# Patient Record
Sex: Female | Born: 1985 | Race: White | Hispanic: No | Marital: Married | Smoking: Current every day smoker
Health system: Southern US, Community
[De-identification: ages and names within clinical notes are randomized; demographics above are authoritative.]

## PROBLEM LIST (undated history)

## (undated) DIAGNOSIS — F419 Anxiety disorder, unspecified: Secondary | ICD-10-CM

## (undated) DIAGNOSIS — R519 Headache, unspecified: Secondary | ICD-10-CM

## (undated) DIAGNOSIS — Z8719 Personal history of other diseases of the digestive system: Secondary | ICD-10-CM

## (undated) DIAGNOSIS — Z9889 Other specified postprocedural states: Secondary | ICD-10-CM

## (undated) DIAGNOSIS — G500 Trigeminal neuralgia: Secondary | ICD-10-CM

## (undated) DIAGNOSIS — A281 Cat-scratch disease: Secondary | ICD-10-CM

## (undated) DIAGNOSIS — T8859XA Other complications of anesthesia, initial encounter: Secondary | ICD-10-CM

## (undated) DIAGNOSIS — R112 Nausea with vomiting, unspecified: Secondary | ICD-10-CM

## (undated) DIAGNOSIS — R7303 Prediabetes: Secondary | ICD-10-CM

## (undated) HISTORY — PX: OTHER SURGICAL HISTORY: SHX169

## (undated) HISTORY — PX: CHOLECYSTECTOMY: SHX55

## (undated) HISTORY — PX: ABDOMINAL HYSTERECTOMY: SHX81

---

## 2020-03-08 ENCOUNTER — Encounter (HOSPITAL_COMMUNITY): Payer: Self-pay | Admitting: Emergency Medicine

## 2020-03-08 ENCOUNTER — Emergency Department (HOSPITAL_COMMUNITY)
Admission: EM | Admit: 2020-03-08 | Discharge: 2020-03-08 | Disposition: A | Discharge status: Home / Self Care | Payer: BC Managed Care – PPO | Attending: Emergency Medicine | Admitting: Emergency Medicine

## 2020-03-08 ENCOUNTER — Other Ambulatory Visit: Payer: Self-pay

## 2020-03-08 DIAGNOSIS — F17210 Nicotine dependence, cigarettes, uncomplicated: Secondary | ICD-10-CM | POA: Insufficient documentation

## 2020-03-08 DIAGNOSIS — G500 Trigeminal neuralgia: Secondary | ICD-10-CM | POA: Insufficient documentation

## 2020-03-08 DIAGNOSIS — G501 Atypical facial pain: Secondary | ICD-10-CM | POA: Diagnosis present

## 2020-03-08 HISTORY — DX: Trigeminal neuralgia: G50.0

## 2020-03-08 MED ORDER — HYDROMORPHONE HCL 1 MG/ML IJ SOLN
1.0000 mg | Freq: Once | INTRAMUSCULAR | Status: AC
  Administered 2020-03-08: 1 mg via INTRAMUSCULAR
  Filled 2020-03-08: qty 1

## 2020-03-08 MED ORDER — CARBAMAZEPINE ER 100 MG PO TB12: 100.0000 mg | ORAL_TABLET | Freq: Two times a day (BID) | ORAL | 0 refills | Status: AC

## 2020-03-08 MED ORDER — HYDROCODONE-ACETAMINOPHEN 5-325 MG PO TABS
2.0000 | ORAL_TABLET | ORAL | 0 refills | Status: DC | PRN
Start: 2020-03-08 — End: 2020-03-11

## 2020-03-08 MED ORDER — CARBAMAZEPINE ER 100 MG PO TB12: 100.0000 mg | ORAL_TABLET | Freq: Two times a day (BID) | ORAL | 0 refills | Status: DC

## 2020-03-08 NOTE — ED Provider Notes (Signed)
[facility]-EMERGENCY DEPT Provider Note   CSN: [id number] Arrival date & time: 03/08/20  1640     History Chief Complaint  Patient presents with  . Trigeminal Neuralgia    Diana Ibarra is a 34 y.o. female.  HPI Patient presents with right-sided facial pain.  History of trigeminal neuralgia for years now.  States that she has been seen by neurosurgery and had a surgery but separated the nerve from the artery.  All this was done in [place].  Recently moved to [facility].  Does not have a provider here yet.  States she flares up when he gets cold.  States she is on Tegretol but does not initially remember the dose.  Later told nursing she is on 100 mg.  Pain is severe and on the right side of her face.  Typical to pain she has had before.  Worse with movement and palpation.    Past Medical History:  Diagnosis Date  . Trigeminal neuralgia     There are no problems to display for this patient.   Past Surgical History:  Procedure Laterality Date  . ABDOMINAL HYSTERECTOMY       OB History   No obstetric history on file.     History reviewed. No pertinent family history.  Social History   Tobacco Use  . Smoking status: Current Every Day Smoker    Types: Cigarettes  . Smokeless tobacco: Never Used  Vaping Use  . Vaping Use: Never used  Substance Use Topics  . Alcohol use: Never  . Drug use: Never    Home Medications Prior to Admission medications   Medication Sig Start Date End Date Taking? Authorizing Provider  carbamazepine (TEGRETOL-XR) 100 MG 12 hr tablet Take 1 tablet (100 mg total) by mouth 2 (two) times daily. 03/08/20   Benjiman Core, MD    Allergies    Penicillins and Tylenol with codeine #3 [acetaminophen-codeine]  Review of Systems   Review of Systems  Constitutional: Negative for appetite change.  HENT: Negative for facial swelling, trouble swallowing and voice change.        Right facial pain.  Eyes: Negative for  pain and visual disturbance.  Respiratory: Negative for shortness of breath.   Cardiovascular: Negative for chest pain.  Gastrointestinal: Negative for abdominal pain.  Musculoskeletal: Negative for back pain.  Skin: Negative for rash.  Neurological: Negative for weakness and numbness.    Physical Exam Updated Vital Signs BP 134/88 (BP Location: Left Arm)   Pulse 86   Temp (!) 97.3 F (36.3 C) (Oral)   Resp 18   Ht 5\' 6"  (1.676 m)   Wt 98.9 kg   SpO2 98%   BMI 35.19 kg/m   Physical Exam Vitals and nursing note reviewed.  Constitutional:      Comments: Patient appears in pain.  HENT:     Head: Atraumatic.     Comments: Tattoo on her right lower posterior skull.  It is the date that she shaved her head for the surgery.  Tenderness right face.  No skin changes.  No swelling. Eyes:     Pupils: Pupils are equal, round, and reactive to light.  Cardiovascular:     Rate and Rhythm: Regular rhythm.  Pulmonary:     Breath sounds: No wheezing or rhonchi.  Abdominal:     Tenderness: There is no abdominal tenderness.  Musculoskeletal:        General: No tenderness.     Cervical back: Neck supple.  Skin:    General: Skin is warm.     Capillary Refill: Capillary refill takes less than 2 seconds.  Neurological:     Mental Status: She is alert and oriented to person, place, and time.     ED Results / Procedures / Treatments   Labs (all labs ordered are listed, but only abnormal results are displayed) Labs Reviewed - No data to display  EKG None  Radiology No results found.  Procedures Procedures (including critical care time)  Medications Ordered in ED Medications  HYDROmorphone (DILAUDID) injection 1 mg (1 mg Intramuscular Given 03/08/20 1843)    ED Course  I have reviewed the triage vital signs and the nursing notes.  Pertinent labs & imaging results that were available during my care of the patient were reviewed by me and considered in my medical decision  making (see chart for details).    MDM Rules/Calculators/A&P                          Patient with history of trigeminal neuralgia.  Only takes medicines when she flares up.  Recently moved to [place].  Will restart her carbamazepine with a short course.  Can follow-up with neurosurgery like she was previously.  Treated with pain medicines here.  Do not think she needs much further work-up with known diagnosis   Final Clinical Impression(s) / ED Diagnoses Final diagnoses:  Trigeminal neuralgia    Rx / DC Orders ED Discharge Orders         Ordered    carbamazepine (TEGRETOL-XR) 100 MG 12 hr tablet  2 times daily        03/08/20 1939           Benjiman Core, MD 03/08/20 1942

## 2020-03-08 NOTE — Discharge Instructions (Signed)
Follow-up with neurosurgery to see if they can help with the symptoms.

## 2020-03-08 NOTE — ED Triage Notes (Signed)
Patient reports she has a flare up of her trigeminal neuralgia for 1 week  now. Pain rated 5/10. Right side.

## 2020-03-11 ENCOUNTER — Emergency Department (HOSPITAL_COMMUNITY)
Admission: EM | Admit: 2020-03-11 | Discharge: 2020-03-11 | Disposition: A | Discharge status: Home / Self Care | Payer: BC Managed Care – PPO | Attending: Emergency Medicine | Admitting: Emergency Medicine

## 2020-03-11 ENCOUNTER — Other Ambulatory Visit: Payer: Self-pay

## 2020-03-11 ENCOUNTER — Encounter (HOSPITAL_COMMUNITY): Payer: Self-pay

## 2020-03-11 DIAGNOSIS — Z8669 Personal history of other diseases of the nervous system and sense organs: Secondary | ICD-10-CM | POA: Diagnosis not present

## 2020-03-11 DIAGNOSIS — Z79899 Other long term (current) drug therapy: Secondary | ICD-10-CM | POA: Insufficient documentation

## 2020-03-11 DIAGNOSIS — R519 Headache, unspecified: Secondary | ICD-10-CM | POA: Insufficient documentation

## 2020-03-11 DIAGNOSIS — F17210 Nicotine dependence, cigarettes, uncomplicated: Secondary | ICD-10-CM | POA: Insufficient documentation

## 2020-03-11 MED ORDER — HYDROCODONE-ACETAMINOPHEN 5-325 MG PO TABS
1.0000 | ORAL_TABLET | Freq: Four times a day (QID) | ORAL | 0 refills | Status: DC | PRN
Start: 2020-03-11 — End: 2020-04-07

## 2020-03-11 MED ORDER — HYDROMORPHONE HCL 1 MG/ML IJ SOLN
1.0000 mg | Freq: Once | INTRAMUSCULAR | Status: AC
  Administered 2020-03-11: 1 mg via INTRAMUSCULAR
  Filled 2020-03-11: qty 1

## 2020-03-11 NOTE — ED Triage Notes (Signed)
Patient c/o history of trigeminal neuralgia on the right side. Patient reports that she is out of Hydrocodon and the pain has returned.

## 2020-03-11 NOTE — ED Provider Notes (Signed)
[facility]-EMERGENCY DEPT Provider Note   CSN: [id number] Arrival date & time: 03/11/20  1129     History Chief Complaint  Patient presents with  . Trigeminal Neuralgia    Diana Ibarra is a 34 y.o. female.  HPI      Diana Ibarra is a 34 y.o. female, with a history of trigeminal neuralgia, presenting to the ED with right-sided facial pain which she associates with flare of trigeminal neuralgia.  Pain is severe, sharp, with spikes of shocklike pain, radiating throughout the right side of the face.   She states she recently moved to the area from [place] and was previously managed by a neurosurgeon there. She has been taking ibuprofen and the Tegretol, however, her pain is still severe.  Since she was seen in the ED on November 27, patient has since made an appointment with neurosurgery and will be seen in the office at 1 PM on December 2.  Denies fever, numbness, other pain, difficulty swallowing, facial droop, vision changes, hearing changes, rash, or any other complaints.   Past Medical History:  Diagnosis Date  . Trigeminal neuralgia     There are no problems to display for this patient.   Past Surgical History:  Procedure Laterality Date  . ABDOMINAL HYSTERECTOMY       OB History   No obstetric history on file.     Family History  Problem Relation Age of Onset  . Cancer Mother     Social History   Tobacco Use  . Smoking status: Current Every Day Smoker    Packs/day: 0.15    Types: Cigarettes  . Smokeless tobacco: Never Used  Vaping Use  . Vaping Use: Never used  Substance Use Topics  . Alcohol use: Never  . Drug use: Never    Home Medications Prior to Admission medications   Medication Sig Start Date End Date Taking? Authorizing Provider  carbamazepine (TEGRETOL-XR) 100 MG 12 hr tablet Take 1 tablet (100 mg total) by mouth 2 (two) times daily. 03/08/20   Benjiman Core, MD  HYDROcodone-acetaminophen (NORCO/VICODIN)  5-325 MG tablet Take 1 tablet by mouth every 6 (six) hours as needed for severe pain. 03/11/20   Hania Cerone C, PA-C    Allergies    Penicillins and Tylenol with codeine #3 [acetaminophen-codeine]  Review of Systems   Review of Systems  Constitutional: Negative for chills and fever.  HENT: Negative for facial swelling, trouble swallowing and voice change.        Right-sided facial pain  Eyes: Negative for visual disturbance.  Respiratory: Negative for shortness of breath.   Cardiovascular: Negative for chest pain.  Gastrointestinal: Negative for nausea and vomiting.  Musculoskeletal: Negative for neck pain and neck stiffness.  Skin: Negative for rash.  Neurological: Negative for dizziness, weakness, numbness and headaches.    Physical Exam Updated Vital Signs BP (!) 126/96 (BP Location: Right Arm)   Pulse 100   Temp 97.6 F (36.4 C) (Oral)   Resp 16   Ht 5\' 6"  (1.676 m)   Wt 99.8 kg   SpO2 98%   BMI 35.51 kg/m   Physical Exam Vitals and nursing note reviewed.  Constitutional:      General: She is not in acute distress.    Appearance: She is well-developed. She is not diaphoretic.  HENT:     Head: Normocephalic and atraumatic.     Comments: Right-sided facial tenderness without swelling, color abnormality, lesions, or any other noted abnormalities.  Mouth/Throat:     Mouth: Mucous membranes are moist.     Pharynx: Oropharynx is clear.     Comments: No noted area of intraoral swelling or fluctuance.  No trismus or noted abnormal phonation.  Mouth opening to at least 3 finger widths.  Handles oral secretions without difficulty.  No sublingual swelling or tongue elevation.  No swelling or tenderness to the submental or submandibular regions.  No swelling or tenderness into the soft tissues of the neck. Eyes:     Extraocular Movements: Extraocular movements intact.     Conjunctiva/sclera: Conjunctivae normal.     Pupils: Pupils are equal, round, and reactive to light.   Cardiovascular:     Rate and Rhythm: Normal rate and regular rhythm.  Pulmonary:     Effort: Pulmonary effort is normal.  Musculoskeletal:     Cervical back: Neck supple.  Skin:    General: Skin is warm and dry.     Coloration: Skin is not pale.  Neurological:     Mental Status: She is alert.  Psychiatric:        Behavior: Behavior normal.     ED Results / Procedures / Treatments   Labs (all labs ordered are listed, but only abnormal results are displayed) Labs Reviewed - No data to display  EKG None  Radiology No results found.  Procedures Procedures (including critical care time)  Medications Ordered in ED Medications  HYDROmorphone (DILAUDID) injection 1 mg (1 mg Intramuscular Given 03/11/20 1349)    ED Course  I have reviewed the triage vital signs and the nursing notes.  Pertinent labs & imaging results that were available during my care of the patient were reviewed by me and considered in my medical decision making (see chart for details).    MDM Rules/Calculators/A&P                          Patient presents with right-sided facial pain that she associates with flare of her trigeminal neuralgia. Since patient states she was able to secure follow-up and she is experiencing acute pain, we will extend some analgesics to bridge until she can see the specialist.  The patient was given instructions for home care as well as return precautions. Patient voices understanding of these instructions, accepts the plan, and is comfortable with discharge.  Findings and plan of care discussed with Pricilla Loveless, MD.   Final Clinical Impression(s) / ED Diagnoses Final diagnoses:  Right sided facial pain    Rx / DC Orders ED Discharge Orders         Ordered    HYDROcodone-acetaminophen (NORCO/VICODIN) 5-325 MG tablet  Every 6 hours PRN        03/11/20 1427           Anselm Pancoast, PA-C 03/12/20 1013    Pricilla Loveless, MD 03/17/20 2029

## 2020-03-11 NOTE — Discharge Instructions (Signed)
Continue with the Tegretol, as prescribed. Follow-up with the neurosurgery office, as planned.

## 2020-04-07 ENCOUNTER — Other Ambulatory Visit: Payer: Self-pay | Admitting: Neurological Surgery

## 2020-04-12 ENCOUNTER — Emergency Department (HOSPITAL_COMMUNITY)
Admission: EM | Admit: 2020-04-12 | Discharge: 2020-04-12 | Disposition: A | Discharge status: Home / Self Care | Payer: BC Managed Care – PPO | Source: Home / Self Care

## 2020-04-12 ENCOUNTER — Encounter (HOSPITAL_COMMUNITY): Payer: Self-pay | Admitting: Obstetrics and Gynecology

## 2020-04-12 ENCOUNTER — Other Ambulatory Visit: Payer: Self-pay

## 2020-04-12 DIAGNOSIS — Z5321 Procedure and treatment not carried out due to patient leaving prior to being seen by health care provider: Secondary | ICD-10-CM | POA: Insufficient documentation

## 2020-04-12 DIAGNOSIS — G500 Trigeminal neuralgia: Secondary | ICD-10-CM | POA: Insufficient documentation

## 2020-04-12 NOTE — ED Triage Notes (Signed)
Patient reports to the ER for Trigeminal neuralgia pain. Patient has a known hx of this and is set to have surgery on Tuesday.

## 2020-04-14 ENCOUNTER — Encounter (HOSPITAL_COMMUNITY): Payer: Self-pay | Admitting: Neurological Surgery

## 2020-04-14 ENCOUNTER — Other Ambulatory Visit (HOSPITAL_COMMUNITY)
Admission: RE | Admit: 2020-04-14 | Discharge: 2020-04-14 | Disposition: A | Discharge status: Home / Self Care | Payer: BC Managed Care – PPO | Source: Ambulatory Visit | Attending: Neurological Surgery | Admitting: Neurological Surgery

## 2020-04-14 DIAGNOSIS — Z20822 Contact with and (suspected) exposure to covid-19: Secondary | ICD-10-CM | POA: Insufficient documentation

## 2020-04-14 DIAGNOSIS — Z01812 Encounter for preprocedural laboratory examination: Secondary | ICD-10-CM | POA: Insufficient documentation

## 2020-04-14 LAB — SARS CORONAVIRUS 2 (TAT 6-24 HRS): SARS Coronavirus 2: NEGATIVE

## 2020-04-14 NOTE — Progress Notes (Signed)
PCP: Patient just moved from [place].  Has not established care.  Cardiologist:  Denies  EKG:  N/A CXR:  N/A ECHO:  denies Stress Test: Denies Cardiac Cath: Denies  Fasting Blood Sugar-  N/A Checks Blood Sugar_N/A__ times a day  OSA/CPAP:  No  ASA/Blood Thinners:  No  Covid test 04/14/20  Anesthesia Review:  No  Patient denies shortness of breath, fever, cough, and chest pain at PAT appointment.  Patient verbalized understanding of instructions provided today at the PAT appointment.  Patient asked to review instructions at home and day of surgery.

## 2020-04-15 ENCOUNTER — Inpatient Hospital Stay (HOSPITAL_COMMUNITY): Payer: BC Managed Care – PPO | Admitting: Anesthesiology

## 2020-04-15 ENCOUNTER — Encounter (HOSPITAL_COMMUNITY): Payer: Self-pay | Admitting: Neurological Surgery

## 2020-04-15 ENCOUNTER — Encounter (HOSPITAL_COMMUNITY)
Admission: RE | Disposition: A | Discharge status: Home / Self Care | Payer: Self-pay | Source: Home / Self Care | Attending: Neurological Surgery

## 2020-04-15 ENCOUNTER — Other Ambulatory Visit: Payer: Self-pay

## 2020-04-15 ENCOUNTER — Inpatient Hospital Stay (HOSPITAL_COMMUNITY)
Admission: RE | Admit: 2020-04-15 | Discharge: 2020-04-18 | DRG: 027 | Disposition: A | Discharge status: Home / Self Care | Payer: BC Managed Care – PPO | Attending: Neurological Surgery | Admitting: Neurological Surgery

## 2020-04-15 DIAGNOSIS — F419 Anxiety disorder, unspecified: Secondary | ICD-10-CM | POA: Diagnosis present

## 2020-04-15 DIAGNOSIS — Z20822 Contact with and (suspected) exposure to covid-19: Secondary | ICD-10-CM | POA: Diagnosis present

## 2020-04-15 DIAGNOSIS — G500 Trigeminal neuralgia: Secondary | ICD-10-CM | POA: Diagnosis present

## 2020-04-15 DIAGNOSIS — F17210 Nicotine dependence, cigarettes, uncomplicated: Secondary | ICD-10-CM | POA: Diagnosis present

## 2020-04-15 DIAGNOSIS — R110 Nausea: Secondary | ICD-10-CM | POA: Diagnosis not present

## 2020-04-15 HISTORY — DX: Cat-scratch disease: A28.1

## 2020-04-15 HISTORY — DX: Other specified postprocedural states: Z98.890

## 2020-04-15 HISTORY — PX: CRANIECTOMY: SHX331

## 2020-04-15 HISTORY — DX: Other complications of anesthesia, initial encounter: T88.59XA

## 2020-04-15 HISTORY — DX: Other specified postprocedural states: R11.2

## 2020-04-15 HISTORY — DX: Anxiety disorder, unspecified: F41.9

## 2020-04-15 LAB — CBC
HCT: 44.3 % (ref 36.0–46.0)
Hemoglobin: 14.9 g/dL (ref 12.0–15.0)
MCH: 30.6 pg (ref 26.0–34.0)
MCHC: 33.6 g/dL (ref 30.0–36.0)
MCV: 91 fL (ref 80.0–100.0)
Platelets: 294 10*3/uL (ref 150–400)
RBC: 4.87 MIL/uL (ref 3.87–5.11)
RDW: 11.7 % (ref 11.5–15.5)
WBC: 8.8 10*3/uL (ref 4.0–10.5)
nRBC: 0 % (ref 0.0–0.2)

## 2020-04-15 LAB — BASIC METABOLIC PANEL
Anion gap: 12 (ref 5–15)
BUN: 11 mg/dL (ref 6–20)
CO2: 19 mmol/L — ABNORMAL LOW (ref 22–32)
Calcium: 10.2 mg/dL (ref 8.9–10.3)
Chloride: 106 mmol/L (ref 98–111)
Creatinine, Ser: 0.72 mg/dL (ref 0.44–1.00)
GFR, Estimated: >60 mL/min (ref >60)
Glucose, Bld: 100 mg/dL — ABNORMAL HIGH (ref 70–99)
Potassium: 4 mmol/L (ref 3.5–5.1)
Sodium: 137 mmol/L (ref 135–145)

## 2020-04-15 LAB — TYPE AND SCREEN
ABO/RH(D): O POS
Antibody Screen: NEGATIVE

## 2020-04-15 LAB — MRSA PCR SCREENING: MRSA by PCR: NEGATIVE

## 2020-04-15 LAB — ABO/RH: ABO/RH(D): O POS

## 2020-04-15 SURGERY — CRANIECTOMY FOR TIC DOULOUREUX
Anesthesia: General | Site: Head | Laterality: Right

## 2020-04-15 MED ORDER — ALPRAZOLAM 0.5 MG PO TABS
1.0000 mg | ORAL_TABLET | Freq: Three times a day (TID) | ORAL | Status: DC | PRN
  Administered 2020-04-15 – 2020-04-16 (×2): 1 mg via ORAL
  Filled 2020-04-15 (×2): qty 2

## 2020-04-15 MED ORDER — MIDAZOLAM HCL 2 MG/2ML IJ SOLN
INTRAMUSCULAR | Status: AC
  Filled 2020-04-15: qty 2

## 2020-04-15 MED ORDER — ONDANSETRON HCL 4 MG PO TABS: 4.0000 mg | ORAL_TABLET | Freq: Four times a day (QID) | ORAL | Status: DC | PRN

## 2020-04-15 MED ORDER — ONDANSETRON HCL 4 MG/2ML IJ SOLN
INTRAMUSCULAR | Status: DC | PRN
  Administered 2020-04-15: 4 mg via INTRAVENOUS

## 2020-04-15 MED ORDER — THROMBIN 20000 UNITS EX SOLR: CUTANEOUS | Status: DC | PRN

## 2020-04-15 MED ORDER — BACITRACIN ZINC 500 UNIT/GM EX OINT
TOPICAL_OINTMENT | CUTANEOUS | Status: AC
  Filled 2020-04-15: qty 28.35

## 2020-04-15 MED ORDER — PHENOL 1.4 % MT LIQD: 1.0000 | OROMUCOSAL | Status: DC | PRN

## 2020-04-15 MED ORDER — FENTANYL CITRATE (PF) 250 MCG/5ML IJ SOLN
INTRAMUSCULAR | Status: AC
  Filled 2020-04-15: qty 5

## 2020-04-15 MED ORDER — CEFAZOLIN SODIUM-DEXTROSE 2-3 GM-%(50ML) IV SOLR
INTRAVENOUS | Status: DC | PRN
  Administered 2020-04-15: 2 g via INTRAVENOUS

## 2020-04-15 MED ORDER — THROMBIN 5000 UNITS EX SOLR: OROMUCOSAL | Status: DC | PRN

## 2020-04-15 MED ORDER — LIDOCAINE-EPINEPHRINE 1 %-1:100000 IJ SOLN
INTRAMUSCULAR | Status: DC | PRN
  Administered 2020-04-15: 8 mL

## 2020-04-15 MED ORDER — LACTATED RINGERS IV SOLN: INTRAVENOUS | Status: DC

## 2020-04-15 MED ORDER — ROCURONIUM BROMIDE 10 MG/ML (PF) SYRINGE
PREFILLED_SYRINGE | INTRAVENOUS | Status: DC | PRN
  Administered 2020-04-15: 20 mg via INTRAVENOUS
  Administered 2020-04-15: 30 mg via INTRAVENOUS
  Administered 2020-04-15: 50 mg via INTRAVENOUS
  Administered 2020-04-15: 20 mg via INTRAVENOUS

## 2020-04-15 MED ORDER — HYDROMORPHONE HCL 1 MG/ML IJ SOLN
INTRAMUSCULAR | Status: AC
  Filled 2020-04-15: qty 0.5

## 2020-04-15 MED ORDER — FENTANYL CITRATE (PF) 100 MCG/2ML IJ SOLN
INTRAMUSCULAR | Status: AC
  Filled 2020-04-15: qty 2

## 2020-04-15 MED ORDER — SODIUM CHLORIDE 0.9 % IV SOLN: 250.0000 mL | INTRAVENOUS | Status: DC

## 2020-04-15 MED ORDER — OXYCODONE HCL 5 MG PO TABS
10.0000 mg | ORAL_TABLET | ORAL | Status: DC | PRN
  Administered 2020-04-16: 10 mg via ORAL
  Filled 2020-04-15: qty 2

## 2020-04-15 MED ORDER — ESMOLOL HCL 100 MG/10ML IV SOLN
INTRAVENOUS | Status: DC | PRN
  Administered 2020-04-15: 30 mg via INTRAVENOUS

## 2020-04-15 MED ORDER — ROPINIROLE HCL 1 MG PO TABS
0.5000 mg | ORAL_TABLET | Freq: Every day | ORAL | Status: DC
  Administered 2020-04-15 – 2020-04-17 (×3): 0.5 mg via ORAL
  Filled 2020-04-15 (×4): qty 1

## 2020-04-15 MED ORDER — HEMOSTATIC AGENTS (NO CHARGE) OPTIME
TOPICAL | Status: DC | PRN
  Administered 2020-04-15: 1 via TOPICAL

## 2020-04-15 MED ORDER — LIDOCAINE-EPINEPHRINE 1 %-1:100000 IJ SOLN
INTRAMUSCULAR | Status: AC
  Filled 2020-04-15: qty 1

## 2020-04-15 MED ORDER — FENTANYL CITRATE (PF) 250 MCG/5ML IJ SOLN
INTRAMUSCULAR | Status: DC | PRN
  Administered 2020-04-15 (×2): 50 ug via INTRAVENOUS
  Administered 2020-04-15: 100 ug via INTRAVENOUS
  Administered 2020-04-15: 50 ug via INTRAVENOUS

## 2020-04-15 MED ORDER — SODIUM CHLORIDE 0.9% FLUSH: 3.0000 mL | INTRAVENOUS | Status: DC | PRN

## 2020-04-15 MED ORDER — ONDANSETRON HCL 4 MG/2ML IJ SOLN: 4.0000 mg | Freq: Once | INTRAMUSCULAR | Status: DC | PRN

## 2020-04-15 MED ORDER — LIDOCAINE 2% (20 MG/ML) 5 ML SYRINGE
INTRAMUSCULAR | Status: DC | PRN
  Administered 2020-04-15: 40 mg via INTRAVENOUS

## 2020-04-15 MED ORDER — MENTHOL 3 MG MT LOZG
1.0000 | LOZENGE | OROMUCOSAL | Status: DC | PRN
  Filled 2020-04-15: qty 9

## 2020-04-15 MED ORDER — HYDROMORPHONE HCL 1 MG/ML IJ SOLN
INTRAMUSCULAR | Status: DC | PRN
  Administered 2020-04-15 (×2): .5 mg via INTRAVENOUS

## 2020-04-15 MED ORDER — DEXAMETHASONE SODIUM PHOSPHATE 10 MG/ML IJ SOLN
INTRAMUSCULAR | Status: DC | PRN
  Administered 2020-04-15: 10 mg via INTRAVENOUS

## 2020-04-15 MED ORDER — THROMBIN 5000 UNITS EX SOLR
CUTANEOUS | Status: AC
  Filled 2020-04-15: qty 5000

## 2020-04-15 MED ORDER — POLYETHYLENE GLYCOL 3350 17 G PO PACK: 17.0000 g | PACK | Freq: Every day | ORAL | Status: DC | PRN

## 2020-04-15 MED ORDER — ZOLPIDEM TARTRATE 5 MG PO TABS: 5.0000 mg | ORAL_TABLET | Freq: Every day | ORAL | Status: DC | PRN

## 2020-04-15 MED ORDER — PROPOFOL 10 MG/ML IV BOLUS
INTRAVENOUS | Status: AC
  Filled 2020-04-15: qty 20

## 2020-04-15 MED ORDER — 0.9 % SODIUM CHLORIDE (POUR BTL) OPTIME
TOPICAL | Status: DC | PRN
  Administered 2020-04-15 (×3): 1000 mL

## 2020-04-15 MED ORDER — ONDANSETRON HCL 4 MG/2ML IJ SOLN
4.0000 mg | Freq: Four times a day (QID) | INTRAMUSCULAR | Status: DC | PRN
  Administered 2020-04-15 – 2020-04-16 (×2): 4 mg via INTRAVENOUS
  Filled 2020-04-15 (×2): qty 2

## 2020-04-15 MED ORDER — SODIUM CHLORIDE 0.9 % IV SOLN: INTRAVENOUS | Status: DC

## 2020-04-15 MED ORDER — CHLORHEXIDINE GLUCONATE CLOTH 2 % EX PADS: 6.0000 | MEDICATED_PAD | Freq: Once | CUTANEOUS | Status: DC

## 2020-04-15 MED ORDER — DOCUSATE SODIUM 100 MG PO CAPS
100.0000 mg | ORAL_CAPSULE | Freq: Two times a day (BID) | ORAL | Status: DC
  Administered 2020-04-15 – 2020-04-17 (×5): 100 mg via ORAL
  Filled 2020-04-15 (×6): qty 1

## 2020-04-15 MED ORDER — THROMBIN 20000 UNITS EX SOLR
CUTANEOUS | Status: AC
  Filled 2020-04-15: qty 20000

## 2020-04-15 MED ORDER — ACETAMINOPHEN 325 MG PO TABS
650.0000 mg | ORAL_TABLET | ORAL | Status: DC | PRN
  Administered 2020-04-15 – 2020-04-16 (×6): 650 mg via ORAL
  Filled 2020-04-15 (×6): qty 2

## 2020-04-15 MED ORDER — SUGAMMADEX SODIUM 200 MG/2ML IV SOLN
INTRAVENOUS | Status: DC | PRN
  Administered 2020-04-15 (×4): 100 mg via INTRAVENOUS

## 2020-04-15 MED ORDER — MIDAZOLAM HCL 5 MG/5ML IJ SOLN
INTRAMUSCULAR | Status: DC | PRN
  Administered 2020-04-15 (×2): 2 mg via INTRAVENOUS

## 2020-04-15 MED ORDER — VANCOMYCIN HCL IN DEXTROSE 1-5 GM/200ML-% IV SOLN
1000.0000 mg | INTRAVENOUS | Status: AC
  Administered 2020-04-15: 1000 mg via INTRAVENOUS
  Filled 2020-04-15: qty 200

## 2020-04-15 MED ORDER — ORAL CARE MOUTH RINSE: 15.0000 mL | Freq: Once | OROMUCOSAL | Status: DC

## 2020-04-15 MED ORDER — FENTANYL CITRATE (PF) 100 MCG/2ML IJ SOLN
25.0000 ug | INTRAMUSCULAR | Status: DC | PRN
  Administered 2020-04-15: 50 ug via INTRAVENOUS

## 2020-04-15 MED ORDER — BACITRACIN ZINC 500 UNIT/GM EX OINT
TOPICAL_OINTMENT | CUTANEOUS | Status: DC | PRN
  Administered 2020-04-15: 1 via TOPICAL

## 2020-04-15 MED ORDER — PROPOFOL 10 MG/ML IV BOLUS
INTRAVENOUS | Status: DC | PRN
  Administered 2020-04-15: 150 mg via INTRAVENOUS
  Administered 2020-04-15: 30 mg via INTRAVENOUS
  Administered 2020-04-15 (×2): 50 mg via INTRAVENOUS

## 2020-04-15 MED ORDER — CHLORHEXIDINE GLUCONATE CLOTH 2 % EX PADS
6.0000 | MEDICATED_PAD | Freq: Every day | CUTANEOUS | Status: DC
  Administered 2020-04-15: 6 via TOPICAL

## 2020-04-15 MED ORDER — CHLORHEXIDINE GLUCONATE 0.12 % MT SOLN
15.0000 mL | Freq: Once | OROMUCOSAL | Status: DC
  Filled 2020-04-15: qty 15

## 2020-04-15 MED ORDER — CARBAMAZEPINE ER 100 MG PO TB12
100.0000 mg | ORAL_TABLET | Freq: Two times a day (BID) | ORAL | Status: DC
  Administered 2020-04-15 – 2020-04-18 (×6): 100 mg via ORAL
  Filled 2020-04-15 (×8): qty 1

## 2020-04-15 MED ORDER — SODIUM CHLORIDE 0.9% FLUSH
3.0000 mL | Freq: Two times a day (BID) | INTRAVENOUS | Status: DC
  Administered 2020-04-15 – 2020-04-18 (×6): 3 mL via INTRAVENOUS

## 2020-04-15 MED ORDER — OXYCODONE HCL 5 MG PO TABS
15.0000 mg | ORAL_TABLET | ORAL | Status: DC | PRN
  Administered 2020-04-15 – 2020-04-18 (×7): 15 mg via ORAL
  Filled 2020-04-15 (×8): qty 3

## 2020-04-15 MED ORDER — ACETAMINOPHEN 650 MG RE SUPP: 650.0000 mg | RECTAL | Status: DC | PRN

## 2020-04-15 MED ORDER — DEXMEDETOMIDINE (PRECEDEX) IN NS 20 MCG/5ML (4 MCG/ML) IV SYRINGE
PREFILLED_SYRINGE | INTRAVENOUS | Status: DC | PRN
  Administered 2020-04-15 (×4): 8 ug via INTRAVENOUS
  Administered 2020-04-15: 4 ug via INTRAVENOUS

## 2020-04-15 MED ORDER — HYDROMORPHONE HCL 1 MG/ML IJ SOLN
1.0000 mg | INTRAMUSCULAR | Status: DC | PRN
  Administered 2020-04-15 – 2020-04-18 (×19): 1 mg via INTRAVENOUS
  Filled 2020-04-15 (×20): qty 1

## 2020-04-15 MED ORDER — CEFAZOLIN SODIUM-DEXTROSE 2-4 GM/100ML-% IV SOLN
2.0000 g | Freq: Three times a day (TID) | INTRAVENOUS | Status: AC
Start: 2020-04-15 — End: 2020-04-16
  Administered 2020-04-15 – 2020-04-16 (×2): 2 g via INTRAVENOUS
  Filled 2020-04-15 (×2): qty 100

## 2020-04-15 MED ORDER — MANNITOL 25 % IV SOLN
INTRAVENOUS | Status: DC | PRN
  Administered 2020-04-15: 62.5 g via INTRAVENOUS

## 2020-04-15 SURGICAL SUPPLY — 60 items
BAND RUBBER #18 3X1/16 STRL (MISCELLANEOUS) ×4 IMPLANT
BLADE CLIPPER SURG (BLADE) ×2 IMPLANT
BUR ACORN 9.0 PRECISION (BURR) ×2 IMPLANT
BUR MATCHSTICK NEURO 3.0 LAGG (BURR) ×2 IMPLANT
BUR ROUND FLUTED 4 SOFT TCH (BURR) ×2 IMPLANT
BUR SPIRAL ROUTER 2.3 (BUR) ×2 IMPLANT
CANISTER SUCT 3000ML PPV (MISCELLANEOUS) ×4 IMPLANT
CATH VENTRIC 35X38 W/TROCAR LG (CATHETERS) IMPLANT
CNTNR URN SCR LID CUP LEK RST (MISCELLANEOUS) ×1 IMPLANT
CONT SPEC 4OZ STRL OR WHT (MISCELLANEOUS) ×1
DRAPE HALF SHEET 40X57 (DRAPES) ×2 IMPLANT
DRAPE MICROSCOPE LEICA (MISCELLANEOUS) ×2 IMPLANT
DRAPE NEUROLOGICAL W/INCISE (DRAPES) ×2 IMPLANT
DRAPE WARM FLUID 44X44 (DRAPES) ×2 IMPLANT
DRSG TELFA 3X8 NADH (GAUZE/BANDAGES/DRESSINGS) ×2 IMPLANT
DURAPREP 6ML APPLICATOR 50/CS (WOUND CARE) ×2 IMPLANT
ELECT REM PT RETURN 9FT ADLT (ELECTROSURGICAL) ×2
ELECTRODE REM PT RTRN 9FT ADLT (ELECTROSURGICAL) ×1 IMPLANT
FELT TEFLON 6X6 (MISCELLANEOUS) ×2 IMPLANT
FORCEPS BIPOLAR SPETZLER 8 1.0 (NEUROSURGERY SUPPLIES) ×2 IMPLANT
GAUZE 4X4 16PLY RFD (DISPOSABLE) IMPLANT
GAUZE SPONGE 4X4 12PLY STRL (GAUZE/BANDAGES/DRESSINGS) ×2 IMPLANT
GLOVE BIOGEL PI IND STRL 7.5 (GLOVE) ×2 IMPLANT
GLOVE BIOGEL PI INDICATOR 7.5 (GLOVE) ×2
GLOVE ECLIPSE 7.5 STRL STRAW (GLOVE) ×4 IMPLANT
GOWN STRL REUS W/ TWL LRG LVL3 (GOWN DISPOSABLE) ×2 IMPLANT
GOWN STRL REUS W/ TWL XL LVL3 (GOWN DISPOSABLE) IMPLANT
GOWN STRL REUS W/TWL 2XL LVL3 (GOWN DISPOSABLE) IMPLANT
GOWN STRL REUS W/TWL LRG LVL3 (GOWN DISPOSABLE) ×2
GOWN STRL REUS W/TWL XL LVL3 (GOWN DISPOSABLE)
GRAFT DURAGEN MATRIX 1WX1L (Tissue) ×2 IMPLANT
HEMOSTAT POWDER KIT SURGIFOAM (HEMOSTASIS) ×2 IMPLANT
HEMOSTAT SURGICEL 2X14 (HEMOSTASIS) IMPLANT
IV NS 1000ML (IV SOLUTION)
IV NS 1000ML BAXH (IV SOLUTION) IMPLANT
KIT BASIN OR (CUSTOM PROCEDURE TRAY) ×2 IMPLANT
KIT DRAIN CSF ACCUDRAIN (MISCELLANEOUS) IMPLANT
KIT TURNOVER KIT B (KITS) ×2 IMPLANT
KNIFE ARACHNOID DISP AM-24-S (MISCELLANEOUS) IMPLANT
NEEDLE HYPO 25X1 1.5 SAFETY (NEEDLE) ×2 IMPLANT
NEEDLE SPNL 18GX3.5 QUINCKE PK (NEEDLE) IMPLANT
NS IRRIG 1000ML POUR BTL (IV SOLUTION) ×6 IMPLANT
PACK CRANIOTOMY CUSTOM (CUSTOM PROCEDURE TRAY) ×2 IMPLANT
PIN MAYFIELD SKULL DISP (PIN) ×2 IMPLANT
PLATE BONE 12 2H TARGET XL (Plate) ×2 IMPLANT
PLATE DOUBLE Y CMF 6H (Plate) ×2 IMPLANT
SCREW UNIII AXS SD 1.5X4 (Screw) ×14 IMPLANT
SEALANT ADHERUS EXTEND TIP (MISCELLANEOUS) ×2 IMPLANT
SPONGE NEURO XRAY DETECT 1X3 (DISPOSABLE) IMPLANT
SPONGE SURGIFOAM ABS GEL 100 (HEMOSTASIS) ×2 IMPLANT
STAPLER VISISTAT 35W (STAPLE) ×2 IMPLANT
SUT MNCRL AB 3-0 PS2 18 (SUTURE) ×2 IMPLANT
SUT NURALON 4 0 TR CR/8 (SUTURE) ×2 IMPLANT
SUT VIC AB 2-0 CP2 18 (SUTURE) ×4 IMPLANT
TOWEL GREEN STERILE (TOWEL DISPOSABLE) ×2 IMPLANT
TOWEL GREEN STERILE FF (TOWEL DISPOSABLE) ×2 IMPLANT
TRAY FOLEY MTR SLVR 16FR STAT (SET/KITS/TRAYS/PACK) ×2 IMPLANT
TUBE CONNECTING 12X1/4 (SUCTIONS) ×2 IMPLANT
UNDERPAD 30X36 HEAVY ABSORB (UNDERPADS AND DIAPERS) ×2 IMPLANT
WATER STERILE IRR 1000ML POUR (IV SOLUTION) ×2 IMPLANT

## 2020-04-15 NOTE — Anesthesia Preprocedure Evaluation (Signed)
Anesthesia Evaluation  Patient identified by MRN, date of birth, ID band Patient awake    Reviewed: Allergy & Precautions, NPO status , Patient's Chart, lab work & pertinent test results  Airway Mallampati: II  TM Distance: >3 FB Neck ROM: Full    Dental  (+) Teeth Intact, Dental Advisory Given   Pulmonary Current Smoker,    breath sounds clear to auscultation       Cardiovascular  Rhythm:Regular Rate:Normal     Neuro/Psych    GI/Hepatic   Endo/Other    Renal/GU      Musculoskeletal   Abdominal   Peds  Hematology   Anesthesia Other Findings   Reproductive/Obstetrics                             Anesthesia Physical Anesthesia Plan  ASA: II  Anesthesia Plan: General   Post-op Pain Management:    Induction: Intravenous  PONV Risk Score and Plan: Ondansetron and Dexamethasone  Airway Management Planned: Oral ETT  Additional Equipment: Arterial line  Intra-op Plan:   Post-operative Plan: Extubation in OR  Informed Consent: I have reviewed the patients History and Physical, chart, labs and discussed the procedure including the risks, benefits and alternatives for the proposed anesthesia with the patient or authorized representative who has indicated his/her understanding and acceptance.     Dental advisory given  Plan Discussed with: CRNA and Anesthesiologist  Anesthesia Plan Comments:         Anesthesia Quick Evaluation

## 2020-04-15 NOTE — Anesthesia Postprocedure Evaluation (Signed)
Anesthesia Post Note  Patient: Diana Ibarra  Procedure(s) Performed: Right repeat retrosigmoid craniotomy and microvascular decompression of fifth nerve (Right Head)     Patient location during evaluation: PACU Anesthesia Type: General Level of consciousness: awake and alert Pain management: pain level controlled Vital Signs Assessment: post-procedure vital signs reviewed and stable Respiratory status: spontaneous breathing, nonlabored ventilation, respiratory function stable and patient connected to nasal cannula oxygen Cardiovascular status: blood pressure returned to baseline and stable Postop Assessment: no apparent nausea or vomiting Anesthetic complications: no   No complications documented.  Last Vitals:  Vitals:   04/15/20 1738 04/15/20 1753  BP: 116/77 113/72  Pulse: 89 80  Resp: 19 14  Temp: 36.6 C   SpO2: 100% 92%    Last Pain:  Vitals:   04/15/20 1738  TempSrc:   PainSc: 4                  Amedio Bowlby S

## 2020-04-15 NOTE — Anesthesia Procedure Notes (Signed)
Date/Time: 04/15/2020 1:11 PM Performed by: Colon Flattery, CRNA

## 2020-04-15 NOTE — Plan of Care (Signed)
SpO2 95% on RA. Resp e/u.

## 2020-04-15 NOTE — Transfer of Care (Signed)
Immediate Anesthesia Transfer of Care Note  Patient: Diana Ibarra  Procedure(s) Performed: Right repeat retrosigmoid craniotomy and microvascular decompression of fifth nerve (Right Head)  Patient Location: PACU  Anesthesia Type:General  Level of Consciousness: awake, alert  and patient cooperative  Airway & Oxygen Therapy: Patient Spontanous Breathing  Post-op Assessment: Report given to RN, Post -op Vital signs reviewed and stable and Patient moving all extremities X 4  Post vital signs: Reviewed and stable  Last Vitals:  Vitals Value Taken Time  BP 116/77 04/15/20 1738  Temp    Pulse 93 04/15/20 1738  Resp 15 04/15/20 1738  SpO2 100 % 04/15/20 1738  Vitals shown include unvalidated device data.  Last Pain:  Vitals:   04/15/20 1103  TempSrc:   PainSc: 8       Patients Stated Pain Goal: 3 (04/15/20 1103)  Complications: No complications documented.

## 2020-04-15 NOTE — H&P (Signed)
Surgical H&P Update  HPI: 35 y.o. woman with h/o TGN s/p MVD w/ delayed recurrence of Sx. 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 No changes in health since she was last seen. Still having symptoms and wishes to proceed with surgery.  PMHx:  Past Medical History:  Diagnosis Date  . Anxiety   . Cat scratch fever   . Complication of anesthesia   . PONV (postoperative nausea and vomiting)   . Trigeminal neuralgia    FamHx:  Family History  Problem Relation Age of Onset  . Cancer Mother    SocHx:  reports that she has been smoking cigarettes. She has been smoking about 0.15 packs per day. She has never used smokeless tobacco. She reports that she does not drink alcohol and does not use drugs.  Physical Exam: AOx3, PERRL, FS with some grimacing on the right, unable to test facial sensation on R 2/2 severe allodynia / pain, TM  Strength 5/5 x4, SILTx4  Assesment/Plan: 35 y.o. woman with recurrent R TGN, here for repeat retrosig for re-exploration and possible repeat MVD. Risks, benefits, and alternatives discussed with patient extensively and the patient would like to continue with surgery.  -OR today -4N post-op  Jadene Pierini, MD 04/15/20 1:07 PM

## 2020-04-15 NOTE — Op Note (Signed)
PATIENT: Diana Ibarra  DAY OF SURGERY: 04/15/20   PRE-OPERATIVE DIAGNOSIS:  Right trigeminal neuralgia   POST-OPERATIVE DIAGNOSIS:  Same   PROCEDURE:  Right repeat retrosigmoid craniotomy, exploration, and microvascular decompression of the 5th cranial nerve   SURGEON:  Surgeon(s) and Role:    Jadene Pierini, MD - Primary    Barnett Abu, MD - Assisting   ANESTHESIA: ETGA   BRIEF HISTORY: This is a 35 year old woman who presented with recurrent right TGN, type 1. She previously had an MVD at another institution years ago with resolution of her pain immediately post-op and continued to have a pain-free interval until recurrence recently. Her symptoms were not responsive to multiple medications, I therefore recommended exploration to evaluate for residual microvascular conflict. This was discussed with the patient as well as risks, benefits, and alternatives and wished to proceed with surgery. We extensively discussed the increased risks associated with repeat surgical intervention, decreased efficacy of repeat MVD, and agreed that if there was no visible conflict, she would like an intra-operative rhizotomy performed to help with her symptoms.    OPERATIVE DETAIL: The patient was taken to the operating room and placed on the OR table in the supine position. A formal time out was performed with two patient identifiers and confirmed the operative site. Anesthesia was induced by the anesthesia team. The head was placed in a [place] head holder and rotated to the left to expose her prior right retrosigmoid incision. This site was marked, hair was clipped with surgical clippers, the area was then prepped and draped in a sterile fashion. The prior right retrosigmoid incision was opened, soft tissues were retracted, and the prior titanium plate and screws were removed. There was, as expected, extensive scar tissue present throughout the case that made dissection significantly more difficult  than in an index case. The bone flap edge was along the transverse sinus and was thankfully dissected free very carefully without any sinus injury. The craniotomy was expanded laterally to reach the T-S junction.  The operating microscope was brought into the field after opening the dura, the petrous face was located, and a cistern was accessed and CSF was drained for brain relaxation. As cisternal dissection proceeded, there was again as expected significant scar tissue. I was able to access cisternal spaces sequentially and move from the inferior 9-11 complex cranially up to the 7-8 complex and then the 5th nerve at the brainstem. There was a large Teflon pledget on the dorsal side of CN5 that was vascularized and adherent. This was carefully mobilized to allow for visualization of the nerve, as it was occupying the entire dissection window. I debulked it internally to allow for better visualization. I found the SCA where it was crossing the nerve distally and placed a new Teflon shredded pledget at this area. I explored the nerve circumferentially without any other areas of neurovascular conflict. I therefore obtained and confirmed hemostasis, closed the dura and placed an onlay of duragen with Adheris (Stryker) fibrin glue to seal the dural repair. The previous bone flap was plated with new titanium plates and screws and replaced.   All instrument and sponge counts were correct, the incision was then closed in layers. The patient was then returned to anesthesia for emergence. No apparent complications at the completion of the procedure.   EBL:    DRAINS: none   SPECIMENS: none   Jadene Pierini, MD 04/15/20 1:16 PM

## 2020-04-15 NOTE — Anesthesia Procedure Notes (Signed)
Arterial Line Insertion Start/End1/07/2020 12:50 PM, 04/15/2020 12:55 PM Performed by: Colon Flattery, CRNA, CRNA  Patient location: Pre-op. Preanesthetic checklist: patient identified, IV checked, site marked, risks and benefits discussed, surgical consent, monitors and equipment checked, pre-op evaluation, timeout performed and anesthesia consent Lidocaine 1% used for infiltration and patient sedated Left, radial was placed Catheter size: 20 G Hand hygiene performed  and maximum sterile barriers used   Attempts: 1 Procedure performed without using ultrasound guided technique. Following insertion, dressing applied and Biopatch. Post procedure assessment: normal  Patient tolerated the procedure well with no immediate complications.

## 2020-04-15 NOTE — Brief Op Note (Signed)
04/15/2020  5:37 PM  PATIENT:  Anastasia Fiedler  35 y.o. female  PRE-OPERATIVE DIAGNOSIS:  Trigeminal neuralgia  POST-OPERATIVE DIAGNOSIS:  Trigeminal neuralgia  PROCEDURE:  Procedure(s): Right repeat retrosigmoid craniotomy and microvascular decompression of fifth nerve (Right)  SURGEON:  Surgeon(s) and Role:    * Jadene Pierini, MD - Primary    * Barnett Abu, MD - Assisting  PHYSICIAN ASSISTANT:   ANESTHESIA:   general  EBL:  150 mL   BLOOD ADMINISTERED:none  DRAINS: none   LOCAL MEDICATIONS USED:  LIDOCAINE   SPECIMEN:  No Specimen  DISPOSITION OF SPECIMEN:  N/A  COUNTS:  YES  TOURNIQUET:  * No tourniquets in log *  DICTATION: .Note written in EPIC  PLAN OF CARE: Admit to inpatient   PATIENT DISPOSITION:  PACU - hemodynamically stable.   Delay start of Pharmacological VTE agent (>24hrs) due to surgical blood loss or risk of bleeding: yes

## 2020-04-16 MED ORDER — ONDANSETRON HCL 4 MG/2ML IJ SOLN
4.0000 mg | Freq: Four times a day (QID) | INTRAMUSCULAR | Status: DC | PRN
  Administered 2020-04-16: 4 mg via INTRAVENOUS
  Filled 2020-04-16: qty 2

## 2020-04-16 MED ORDER — ONDANSETRON HCL 4 MG PO TABS
4.0000 mg | ORAL_TABLET | ORAL | Status: DC | PRN
  Administered 2020-04-16: 4 mg via ORAL
  Filled 2020-04-16 (×2): qty 1

## 2020-04-16 MED ORDER — ONDANSETRON HCL 4 MG PO TABS: 4.0000 mg | ORAL_TABLET | ORAL | Status: DC | PRN

## 2020-04-16 MED ORDER — ONDANSETRON HCL 4 MG/2ML IJ SOLN
4.0000 mg | INTRAMUSCULAR | Status: DC | PRN
  Administered 2020-04-16 – 2020-04-18 (×7): 4 mg via INTRAVENOUS
  Filled 2020-04-16 (×7): qty 2

## 2020-04-16 MED ORDER — HEPARIN SODIUM (PORCINE) 5000 UNIT/ML IJ SOLN
5000.0000 [IU] | Freq: Three times a day (TID) | INTRAMUSCULAR | Status: DC
  Administered 2020-04-17 – 2020-04-18 (×4): 5000 [IU] via SUBCUTANEOUS
  Filled 2020-04-16 (×4): qty 1

## 2020-04-16 NOTE — Progress Notes (Signed)
Neurosurgery Service Progress Note  Subjective: No acute events overnight, no facial pain since preop, lots of incisional soreness, some nausea, no other complaints  Objective: Vitals:   04/16/20 0600 04/16/20 0718 04/16/20 0800 04/16/20 0900  BP: 98/62  106/60 107/72  Pulse: 78  67 73  Resp: (!) 9  12 12   Temp:  98.9 F (37.2 C)    TempSrc:  Oral    SpO2: 98%  98% 100%  Weight:      Height:        Physical Exam: AOx3, PERRL, EOMI, FS & SS, TM, Strength 5/5 x4, SILTx4 Incision c/d/i  Assessment & Plan: 35 y.o. woman s/p rpt MVD, recovering well.  -transfer to floor -can change ondansetron to q4h to help w/ nausea -PT/OT to help with mobilization -SCDs, TEDs, SQH POD2  31  04/16/20 9:21 AM

## 2020-04-16 NOTE — Progress Notes (Signed)
NURSING PROGRESS NOTE   Diana Ibarra is a 35 y.o. female patient transferred from [address], arrived to the unit via stretcher at 1620. Patient complained of headaches of 10/10 as soon she arrived at the unit. Patient got upset when she was informed her pain medicine was not due at the time.  Hot packs applied to right  Side of face.  Patient/Family orientated to room. Information packet given to patient/family. Admission inpatient armband information verified with patient/family to include name and date of birth and placed on patient arm. Side rails up x 2, fall assessment and education completed with patient/family. Patient/family able to verbalize understanding of risk associated with falls and verbalized understanding to call for assistance before getting out of bed. Call light within reach. Patient/family able to voice and demonstrate understanding of unit orientation instructions.    Will continue to evaluate and treat per MD orders.

## 2020-04-16 NOTE — Progress Notes (Signed)
Physical Therapy Evaluation Patient Details Name: Diana Ibarra MRN: [id number] DOB: 07/30/1985 Today's Date: 04/16/2020   History of Present Illness  35 yo female s/p Right repeat retrosigmoid craniotomy and microvascular decompression of fifth nerve PMH cat scratch fever, trigeminal neuralgia, previous Rpt  Clinical Impression  Pt admitted with/for TGN, s/p surgical intervention.  Pt currently in severe pain with nausea and mobility mildly unsteady overall necessitating min guard to occasional min assist.  Pt currently limited functionally due to the problems listed below.  (see problems list.)  Pt will benefit from PT to maximize function and safety to be able to get home safely with available assist .     Follow Up Recommendations No PT follow up;Supervision - Intermittent    Equipment Recommendations  None recommended by PT    Recommendations for Other Services       Precautions / Restrictions Precautions Precautions: Fall      Mobility  Bed Mobility Overal bed mobility: Modified Independent                  Transfers Overall transfer level: Needs assistance   Transfers: Sit to/from Stand Sit to Stand: Supervision         General transfer comment: mild instability.  ?no glasses vs just not keeping eyes open throughout.  Ambulation/Gait Ambulation/Gait assistance: Min guard Gait Distance (Feet): 15 Feet (x2 to/from bathroom)   Gait Pattern/deviations: Step-through pattern   Gait velocity interpretation: <1.8 ft/sec, indicate of risk for recurrent falls General Gait Details: mild instability, reaching for walls, staggered gait  Stairs            Wheelchair Mobility    Modified Rankin (Stroke Patients Only)       Balance Overall balance assessment: Mild deficits observed, not formally tested                                           Pertinent Vitals/Pain Pain Assessment: 0-10 Pain Score: 10-Worst pain ever Pain  Location: head Pain Descriptors / Indicators: Headache    Home Living Family/patient expects to be discharged to:: Private residence Living Arrangements: Spouse/significant other;Children   Type of Home: Apartment Home Access: Stairs to enter Entrance Stairs-Rails: Right;Left   Home Layout: One level   Additional Comments: working , dogs and cats, 2 kids ( 4 total)  youngest is 65 yo . kids ride bus to school    Prior Function Level of Independence: Independent               Hand Dominance   Dominant Hand: Right    Extremity/Trunk Assessment   Upper Extremity Assessment Upper Extremity Assessment: Overall WFL for tasks assessed    Lower Extremity Assessment Lower Extremity Assessment: Overall WFL for tasks assessed (Limited MMT)       Communication   Communication: No difficulties  Cognition Arousal/Alertness: Awake/alert Behavior During Therapy: Flat affect Overall Cognitive Status: Within Functional Limits for tasks assessed                                        General Comments General comments (skin integrity, edema, etc.): VSS overall on RA    Exercises     Assessment/Plan    PT Assessment Patient needs continued PT services  PT Problem List Decreased activity  tolerance;Decreased balance;Pain       PT Treatment Interventions Gait training;Stair training;Functional mobility training;Therapeutic activities;Patient/family education;Balance training    PT Goals (Current goals can be found in the Care Plan section)  Acute Rehab PT Goals Patient Stated Goal: pt didn't state, but expects to go back to work PT Goal Formulation: With patient Time For Goal Achievement: 04/30/20 Potential to Achieve Goals: Good    Frequency Min 3X/week   Barriers to discharge        Co-evaluation               AM-PAC PT "6 Clicks" Mobility  Outcome Measure Help needed turning from your back to your side while in a flat bed without using  bedrails?: A Little Help needed moving from lying on your back to sitting on the side of a flat bed without using bedrails?: A Little Help needed moving to and from a bed to a chair (including a wheelchair)?: A Little Help needed standing up from a chair using your arms (e.g., wheelchair or bedside chair)?: A Little Help needed to walk in hospital room?: A Little Help needed climbing 3-5 steps with a railing? : A Little 6 Click Score: 18    End of Session   Activity Tolerance: Patient limited by pain;Other (comment) (nauseated) Patient left: in bed;with call bell/phone within reach Nurse Communication: Mobility status PT Visit Diagnosis: Unsteadiness on feet (R26.81);Other symptoms and signs involving the nervous system (R29.898)    Time: 9485-4627 PT Time Calculation (min) (ACUTE ONLY): 17 min   Charges:   PT Evaluation $PT Eval Moderate Complexity: 1 Mod          04/16/2020  Jacinto Halim., PT Acute Rehabilitation Services [phone]  (pager) [phone]  (office)  Eliseo Gum Braydon Kullman 04/16/2020, 1:13 PM

## 2020-04-16 NOTE — Evaluation (Signed)
Occupational Therapy Evaluation Patient Details Name: Diana Ibarra MRN: [id number] DOB: 10-01-1985 Today's Date: 04/16/2020    History of Present Illness 35 yo female s/p Right repeat retrosigmoid craniotomy and microvascular decompression of fifth nerve PMH cat scratch fever, trigeminal neuralgia, previous Rpt   Clinical Impression   Patient is s/p R Rpt MVD surgery resulting in functional limitations due to the deficits listed below (see OT problem list). Pt currently with balance deficits during adls and needing energy conservation education. Pt limited by HA. Pt will progress quickly. OT to see one more time to assess balance further.  Patient will benefit from skilled OT acutely to increase independence and safety with ADLS to allow discharge home without follow up.     Follow Up Recommendations  No OT follow up    Equipment Recommendations  None recommended by OT    Recommendations for Other Services       Precautions / Restrictions Precautions Precautions: Fall      Mobility Bed Mobility Overal bed mobility: Modified Independent                  Transfers Overall transfer level: Needs assistance   Transfers: Sit to/from Stand Sit to Stand: Supervision         General transfer comment: mild instability.  ?no glasses vs just not keeping eyes open throughout.    Balance Overall balance assessment: Mild deficits observed, not formally tested                                         ADL either performed or assessed with clinical judgement   ADL Overall ADL's : Needs assistance/impaired Eating/Feeding: Modified independent   Grooming: Wash/dry hands;Modified independent                   Toilet Transfer: Minimal assistance;Regular Toilet;Grab bars Toilet Transfer Details (indicate cue type and reason): steady (A) to progress toward bathroom. pt closing eyes and reaching for environmental supports Toileting- Designer, fashion/clothing and Hygiene: Min guard;Sitting/lateral lean       Functional mobility during ADLs: Min guard General ADL Comments: pt reports nausea and HA. pt able to complete task but with balance deficits noted. pt reluctant to engage in task initially and second visit after 2 hour rest agreeable to oob. pt requesting more medication at end of session. pt wanting medication for nausea with RN arriving     Vision Baseline Vision/History: Wears glasses Wears Glasses: At all times Vision Assessment?: No apparent visual deficits     Perception     Praxis      Pertinent Vitals/Pain Pain Assessment: 0-10 Pain Score: 10-Worst pain ever Pain Location: head Pain Descriptors / Indicators: Headache     Hand Dominance Right   Extremity/Trunk Assessment Upper Extremity Assessment Upper Extremity Assessment: Overall WFL for tasks assessed   Lower Extremity Assessment Lower Extremity Assessment: Overall WFL for tasks assessed (Limited MMT)   Cervical / Trunk Assessment Cervical / Trunk Assessment: Normal   Communication Communication Communication: No difficulties   Cognition Arousal/Alertness: Awake/alert Behavior During Therapy: Flat affect Overall Cognitive Status: Within Functional Limits for tasks assessed                                     General Comments  VSS overall on RA  Exercises     Shoulder Instructions      Home Living Family/patient expects to be discharged to:: Private residence Living Arrangements: Spouse/significant other;Children   Type of Home: Apartment Home Access: Stairs to enter   Entrance Stairs-Rails: Right;Left Home Layout: One level     Bathroom Shower/Tub: Chief Strategy Officer: Handicapped height         Additional Comments: working , dogs and cats, 2 kids ( 4 total)  youngest is 27 yo . kids ride bus to school      Prior Functioning/Environment Level of Independence: Independent                  OT Problem List: Decreased activity tolerance;Impaired balance (sitting and/or standing);Pain      OT Treatment/Interventions: Self-care/ADL training;Therapeutic exercise;Energy conservation;DME and/or AE instruction;Manual therapy;Modalities;Therapeutic activities;Patient/family education;Balance training    OT Goals(Current goals can be found in the care plan section) Acute Rehab OT Goals Patient Stated Goal: pt didn't state, but expects to go back to work OT Goal Formulation: With patient Time For Goal Achievement: 04/30/20 Potential to Achieve Goals: Good  OT Frequency:  (1 more visit)   Barriers to D/C:            Co-evaluation              AM-PAC OT "6 Clicks" Daily Activity     Outcome Measure Help from another person eating meals?: None Help from another person taking care of personal grooming?: None Help from another person toileting, which includes using toliet, bedpan, or urinal?: None Help from another person bathing (including washing, rinsing, drying)?: None Help from another person to put on and taking off regular upper body clothing?: None Help from another person to put on and taking off regular lower body clothing?: None 6 Click Score: 24   End of Session Nurse Communication: Mobility status;Precautions  Activity Tolerance: Patient limited by pain Patient left: in bed;with call bell/phone within reach;with bed alarm set;Other (comment) (Rn in room)  OT Visit Diagnosis: Unsteadiness on feet (R26.81)                Time: 3846-6599 OT Time Calculation (min): 17 min Charges:  OT General Charges $OT Visit: 1 Visit OT Evaluation $OT Eval Low Complexity: 1 Low     Brynn, OTR/L  Acute Rehabilitation Services Pager: [phone] Office: [phone] .   Mateo Flow 04/16/2020, 2:11 PM

## 2020-04-17 ENCOUNTER — Encounter (HOSPITAL_COMMUNITY): Payer: Self-pay | Admitting: Neurological Surgery

## 2020-04-17 NOTE — Progress Notes (Signed)
Physical Therapy Treatment Patient Details Name: Diana Ibarra MRN: [id number] DOB: 12-22-1985 Today's Date: 04/17/2020    History of Present Illness 35 yo female s/p Right repeat retrosigmoid craniotomy and microvascular decompression of fifth nerve PMH cat scratch fever, trigeminal neuralgia, previous Rpt    PT Comments    Pt remains limited by headache at this time but does ambulate for increased distances. Pt received pain medication recently prior to session. Pt is able to ambulate at a supervision level but utilizes UE support of IV pole. Pt will benefit from gait assessment over increased distances and without UE support, however pt declines to don mask to ambulate out of the room at this time due to incision. Acute PT will continue to follow to aide in a return to her prior level of function.  Follow Up Recommendations  No PT follow up;Supervision - Intermittent     Equipment Recommendations  None recommended by PT    Recommendations for Other Services       Precautions / Restrictions Precautions Precautions: Fall Restrictions Weight Bearing Restrictions: No    Mobility  Bed Mobility Overal bed mobility: Independent                Transfers Overall transfer level: Needs assistance Equipment used: None Transfers: Sit to/from Stand Sit to Stand: Supervision            Ambulation/Gait Ambulation/Gait assistance: Supervision Gait Distance (Feet): 30 Feet (additional trial of 12') Assistive device: IV Pole Gait Pattern/deviations: Step-through pattern Gait velocity: reduced Gait velocity interpretation: <1.8 ft/sec, indicate of risk for recurrent falls General Gait Details: pt with slowed step through gait, slight increase in lateral sway   Stairs             Wheelchair Mobility    Modified Rankin (Stroke Patients Only)       Balance Overall balance assessment: Mild deficits observed, not formally tested                                           Cognition Arousal/Alertness: Awake/alert Behavior During Therapy: WFL for tasks assessed/performed Overall Cognitive Status: Within Functional Limits for tasks assessed                                        Exercises      General Comments General comments (skin integrity, edema, etc.): VSS on RA, pt declining to wear mask to ambulate out of room due to incision behind ear      Pertinent Vitals/Pain Pain Assessment: Faces Faces Pain Scale: Hurts whole lot Pain Location: head Pain Descriptors / Indicators: Grimacing Pain Intervention(s): Monitored during session    Home Living                      Prior Function            PT Goals (current goals can now be found in the care plan section) Acute Rehab PT Goals Patient Stated Goal: pt didn't state Progress towards PT goals: Progressing toward goals    Frequency    Min 3X/week      PT Plan Current plan remains appropriate    Co-evaluation              AM-PAC PT "6 Clicks"  Mobility   Outcome Measure  Help needed turning from your back to your side while in a flat bed without using bedrails?: None Help needed moving from lying on your back to sitting on the side of a flat bed without using bedrails?: None Help needed moving to and from a bed to a chair (including a wheelchair)?: A Little Help needed standing up from a chair using your arms (e.g., wheelchair or bedside chair)?: A Little Help needed to walk in hospital room?: A Little Help needed climbing 3-5 steps with a railing? : A Little 6 Click Score: 20    End of Session   Activity Tolerance: Patient limited by pain Patient left: in bed;with call bell/phone within reach Nurse Communication: Mobility status PT Visit Diagnosis: Unsteadiness on feet (R26.81);Other symptoms and signs involving the nervous system (O11.572)     Time: 6203-5597 PT Time Calculation (min) (ACUTE ONLY): 10  min  Charges:  $Gait Training: 8-22 mins                     Arlyss Gandy, PT, DPT Acute Rehabilitation Pager: [phone]    Arlyss Gandy 04/17/2020, 5:05 PM

## 2020-04-17 NOTE — Progress Notes (Signed)
Neurosurgery Service Progress Note  Subjective: No acute events overnight, no facial pain since preop, lots of incisional soreness, some nausea, no other complaints  Objective: Vitals:   04/16/20 1945 04/16/20 2326 04/17/20 0311 04/17/20 0739  BP: 120/79 126/79 (!) 103/56 120/77  Pulse: 69 71 73 65  Resp: 18 18 18 16   Temp: 98 F (36.7 C) 98.2 F (36.8 C) 98.1 F (36.7 C) 98.3 F (36.8 C)  TempSrc: Oral Oral Oral Oral  SpO2: 99% 99% 94% 95%  Weight:      Height:        Physical Exam: AOx3, PERRL, EOMI, FS w/ bilateral subjective facial dec'd but present sensation in R>L V1>V2>V3 , TM, Strength 5/5 x4, SILTx4 Incision c/d/i  Assessment & Plan: 35 y.o. woman s/p rpt MVD, recovering well.  -okay to shower -cont ondansetron prn nausea, cont IVF while she is still too nauseated to take in adequate po, will need to keep inpatient until she can take normal po intake -PT/OT  -SCDs, TEDs, SQH  31  04/17/20 8:47 AM

## 2020-04-18 MED ORDER — ONDANSETRON HCL 4 MG PO TABS: 4.0000 mg | ORAL_TABLET | Freq: Every day | ORAL | 1 refills | Status: DC | PRN

## 2020-04-18 MED ORDER — OXYCODONE HCL 5 MG PO TABS: 10.0000 mg | ORAL_TABLET | ORAL | 0 refills | Status: DC | PRN

## 2020-04-18 NOTE — Plan of Care (Signed)
  Problem: Acute Rehab PT Goals(only PT should resolve) Goal: Pt Will Ambulate Outcome: Adequate for Discharge Goal: Pt Will Go Up/Down Stairs Outcome: Adequate for Discharge   Problem: Acute Rehab OT Goals (only OT should resolve) Goal: Pt. Will Perform Lower Body Dressing Outcome: Adequate for Discharge Goal: OT Additional ADL Goal #1 Outcome: Adequate for Discharge Goal: OT Additional ADL Goal #2 Outcome: Adequate for Discharge

## 2020-04-18 NOTE — Progress Notes (Addendum)
Patient given discharge instructions and stated understanding. Patient said her ride will not be here until 3 today and she still wants her IV pain medication before she leaves.  Patient was told that usually on the day of discharge she is given her pain medication by mouth.  I Told the patients nurse and she is going to talk to the patient about it.

## 2020-04-18 NOTE — Progress Notes (Signed)
Neurosurgery Service Progress Note  Subjective: No acute events overnight, no facial pain since preop, soreness continues, but able to tolerate po fluid intake yesterday and today   Objective: Vitals:   04/17/20 1945 04/17/20 2317 04/18/20 0311 04/18/20 0813  BP: (!) 145/89 121/80 122/77 137/84  Pulse: 76 67 75 70  Resp: 18 18 18 16   Temp: 98.1 F (36.7 C) 98 F (36.7 C) 98 F (36.7 C) 97.9 F (36.6 C)  TempSrc: Oral Oral Oral Oral  SpO2: 98% 99% 98% 99%  Weight:      Height:        Physical Exam: AOx3, PERRL, EOMI, FS w/ bilateral subjective facial dec'd but present sensation in R>L V1>V2>V3 , TM, Strength 5/5 x4, SILTx4 Incision c/d/i  Assessment & Plan: 35 y.o. woman s/p rpt MVD, recovering well.  -okay to shower -discharge home today -PT/OT  -SCDs, TEDs, SQH  31  04/18/20 10:48 AM

## 2020-04-18 NOTE — TOC Transition Note (Signed)
Transition of Care [facility]) - CM/SW Discharge Note   Patient Details  Name: Diana Ibarra MRN: [id number] Date of Birth: 05-29-1985  [facility]) CM/SW Contact:  Kermit Balo, RN Phone Number: 04/18/2020, 12:23 PM   Clinical Narrative:    Pt is discharging home with self care. No f/u per PT/OT and no DME needs.  Pt without a PcP. CM was able to obtain her an appt and place it on AVS.  Pt has supervision at home and transportation to home.   Final next level of care: Home/Self Care Barriers to Discharge: No Barriers Identified   Patient Goals and CMS Choice        Discharge Placement                       Discharge Plan and Services                                     Social Determinants of Health (SDOH) Interventions     Readmission Risk Interventions No flowsheet data found.

## 2020-04-18 NOTE — Discharge Summary (Signed)
Discharge Summary  Date of Admission: 04/15/2020  Date of Discharge: 04/18/20  Attending Physician: Autumn Patty, MD  Hospital Course: Patient was admitted following an uncomplicated redo right MVD. She was recovered in PACU and transferred to 4N. Her preop TGN pain was resolved post-op. She complained of some bilateral face numbness that was non-dermatomal but with sensation still present, just decreased, no facial palsy, hearing was intact. Her hospital course was uncomplicated and the patient was discharged home on 04/19/19. She will follow up in clinic with me in 2 weeks.  Neurologic exam at discharge:  AOx3, PERRL, EOMI, FS w/ dec'd facial sensation R>L V1>V2>V3, TM Strength 5/5 x4, SILTx4, no drift  Discharge diagnosis: Trigeminal neuralgia  Jadene Pierini, MD 04/18/20 10:50 AM

## 2020-04-18 NOTE — Discharge Instructions (Signed)
Discharge Instructions  No restriction in activities, slowly increase your activity back to normal.   Your incision is closed with absorbable sutures. These will naturally fall off over the next 4-6 weeks. If they become bothersome or cause discomfort, apply some antibiotic ointment like bacitracin or neosporin on the sutures. This will soften them up and usually makes them more comfortable while they dissolve.  Okay to shower on the day of discharge. Be gentle when cleaning your incision. Use regular soap and water. If that is uncomfortable, try using baby shampoo. Do not submerge the wound under water for 2 weeks after surgery.  Follow up with Dr. Nohelani Benning in 2 weeks after discharge. If you do not already have a discharge appointment, please call his office at [phone] to schedule a follow up appointment. If you have any concerns or questions, please call the office and let us know. 

## 2020-04-18 NOTE — Progress Notes (Signed)
OT Cancellation Note  Patient Details Name: Diana Ibarra MRN: [id number] DOB: 01-Oct-1985   Cancelled Treatment:    Reason Eval/Treat Not Completed: Pain limiting ability to participate. RN notified with plan to administer pain meds soon. OT to check back as time allows.   Kallie Edward OTR/L Supplemental OT, Department of rehab services [phone]  [place] Doig R H. 04/18/2020, 11:40 AM

## 2020-04-18 NOTE — Plan of Care (Signed)
  Problem: Education: Goal: Knowledge of General Education information will improve Description: Including pain rating scale, medication(s)/side effects and non-pharmacologic comfort measures Outcome: Adequate for Discharge   Problem: Health Behavior/Discharge Planning: Goal: Ability to manage health-related needs will improve Outcome: Adequate for Discharge   Problem: Clinical Measurements: Goal: Ability to maintain clinical measurements within normal limits will improve Outcome: Adequate for Discharge Goal: Will remain free from infection Outcome: Adequate for Discharge Goal: Diagnostic test results will improve Outcome: Adequate for Discharge Goal: Cardiovascular complication will be avoided Outcome: Adequate for Discharge   Problem: Activity: Goal: Risk for activity intolerance will decrease Outcome: Adequate for Discharge   Problem: Nutrition: Goal: Adequate nutrition will be maintained Outcome: Adequate for Discharge   Problem: Coping: Goal: Level of anxiety will decrease Outcome: Adequate for Discharge   Problem: Elimination: Goal: Will not experience complications related to bowel motility Outcome: Adequate for Discharge Goal: Will not experience complications related to urinary retention Outcome: Adequate for Discharge   Problem: Pain Managment: Goal: General experience of comfort will improve Outcome: Adequate for Discharge   Problem: Safety: Goal: Ability to remain free from injury will improve Outcome: Adequate for Discharge   Problem: Skin Integrity: Goal: Risk for impaired skin integrity will decrease Outcome: Adequate for Discharge   Problem: Education: Goal: Knowledge of the prescribed therapeutic regimen will improve Outcome: Adequate for Discharge   Problem: Clinical Measurements: Goal: Usual level of consciousness will be regained or maintained. Outcome: Adequate for Discharge Goal: Neurologic status will improve Outcome: Adequate for  Discharge Goal: Ability to maintain intracranial pressure will improve Outcome: Adequate for Discharge   Problem: Skin Integrity: Goal: Demonstration of wound healing without infection will improve Outcome: Adequate for Discharge

## 2020-05-07 ENCOUNTER — Other Ambulatory Visit: Payer: Self-pay

## 2020-05-07 ENCOUNTER — Encounter (HOSPITAL_COMMUNITY): Payer: Self-pay | Admitting: *Deleted

## 2020-05-07 ENCOUNTER — Emergency Department (HOSPITAL_COMMUNITY)
Admission: EM | Admit: 2020-05-07 | Discharge: 2020-05-07 | Disposition: A | Discharge status: Home / Self Care | Payer: BC Managed Care – PPO | Attending: Emergency Medicine | Admitting: Emergency Medicine

## 2020-05-07 DIAGNOSIS — Z5321 Procedure and treatment not carried out due to patient leaving prior to being seen by health care provider: Secondary | ICD-10-CM | POA: Diagnosis not present

## 2020-05-07 DIAGNOSIS — G8918 Other acute postprocedural pain: Secondary | ICD-10-CM | POA: Diagnosis not present

## 2020-05-07 DIAGNOSIS — R42 Dizziness and giddiness: Secondary | ICD-10-CM | POA: Diagnosis present

## 2020-05-07 NOTE — ED Notes (Signed)
Called 3x for room placement. Eloped from waiting area.  

## 2020-05-07 NOTE — ED Triage Notes (Signed)
Pt had surgery 04/15/20 for Trigeminal Neuralgia pain, Since she has had difficulty with dizziness and pain control. She has called her surgeon with no return call.

## 2020-05-07 NOTE — ED Notes (Signed)
Pt called for rooming. Second attempt. No answer!

## 2020-05-11 ENCOUNTER — Encounter (HOSPITAL_COMMUNITY): Payer: Self-pay | Admitting: Emergency Medicine

## 2020-05-11 ENCOUNTER — Other Ambulatory Visit: Payer: Self-pay

## 2020-05-11 ENCOUNTER — Emergency Department (HOSPITAL_COMMUNITY)
Admission: EM | Admit: 2020-05-11 | Discharge: 2020-05-11 | Disposition: A | Discharge status: Home / Self Care | Payer: BC Managed Care – PPO | Attending: Emergency Medicine | Admitting: Emergency Medicine

## 2020-05-11 DIAGNOSIS — Z760 Encounter for issue of repeat prescription: Secondary | ICD-10-CM | POA: Insufficient documentation

## 2020-05-11 DIAGNOSIS — R519 Headache, unspecified: Secondary | ICD-10-CM | POA: Diagnosis present

## 2020-05-11 DIAGNOSIS — F17210 Nicotine dependence, cigarettes, uncomplicated: Secondary | ICD-10-CM | POA: Diagnosis not present

## 2020-05-11 MED ORDER — OXYCODONE-ACETAMINOPHEN 5-325 MG PO TABS: 1.0000 | ORAL_TABLET | Freq: Four times a day (QID) | ORAL | 0 refills | Status: DC | PRN

## 2020-05-11 MED ORDER — OXYCODONE-ACETAMINOPHEN 5-325 MG PO TABS
1.0000 | ORAL_TABLET | Freq: Once | ORAL | Status: AC
  Administered 2020-05-11: 1 via ORAL
  Filled 2020-05-11: qty 1

## 2020-05-11 NOTE — Discharge Instructions (Addendum)
Please return for any problem.  °

## 2020-05-11 NOTE — ED Provider Notes (Signed)
[facility]-EMERGENCY DEPT Provider Note   CSN: [id number] Arrival date & time: 05/11/20  1333     History Chief Complaint  Patient presents with  . Headache    Diana Ibarra is a 35 y.o. female.  35 year old female with prior medical history as detailed below presents for evaluation. Patient reports recent surgery with Dr. Johnsie Cancel for treatment of right sided trigeminal neuralgia. Patient complains of persistent pain to the right scalp and right face after this procedure. She apparently has contacted Dr. Johnsie Cancel in the postoperative period to complain of continued discomfort. Patient reports that oxycodone does help with her pain. She reports that she is out of her pain medication today. Patient reports that her next appointment with Johnsie Cancel is on Wednesday of this week at 9:45 the morning.  She denies associated fever, nausea, vomiting, visual change, focal weakness, or other acute complaint.  The history is provided by the patient and medical records.  Headache Pain location:  R parietal Quality:  Sharp Radiates to:  Does not radiate Timing:  Constant Progression:  Unchanged Chronicity:  Chronic Similar to prior headaches: yes   Relieved by:  Nothing Worsened by:  Nothing      Past Medical History:  Diagnosis Date  . Anxiety   . Cat scratch fever   . Complication of anesthesia   . PONV (postoperative nausea and vomiting)   . Trigeminal neuralgia     Patient Active Problem List   Diagnosis Date Noted  . Trigeminal neuralgia 04/15/2020    Past Surgical History:  Procedure Laterality Date  . ABDOMINAL HYSTERECTOMY    . CHOLECYSTECTOMY    . CRANIECTOMY Right 04/15/2020   Procedure: Right repeat retrosigmoid craniotomy and microvascular decompression of fifth nerve;  Surgeon: Jadene Pierini, MD;  Location: [facility] OR;  Service: Neurosurgery;  Laterality: Right;     OB History   No obstetric history on file.     Family History   Problem Relation Age of Onset  . Cancer Mother     Social History   Tobacco Use  . Smoking status: Current Every Day Smoker    Packs/day: 0.15    Types: Cigarettes  . Smokeless tobacco: Never Used  Vaping Use  . Vaping Use: Never used  Substance Use Topics  . Alcohol use: Never  . Drug use: Never    Home Medications Prior to Admission medications   Medication Sig Start Date End Date Taking? Authorizing Provider  oxyCODONE-acetaminophen (PERCOCET/ROXICET) 5-325 MG tablet Take 1 tablet by mouth every 6 (six) hours as needed for severe pain. 05/11/20  Yes Wynetta Fines, MD  ALPRAZolam Prudy Feeler) 1 MG tablet Take 1 mg by mouth 3 (three) times daily as needed for anxiety. 04/02/20   [provider]  carbamazepine (TEGRETOL-XR) 100 MG 12 hr tablet Take 1 tablet (100 mg total) by mouth 2 (two) times daily. 03/08/20   Benjiman Core, MD  estradiol (ESTRACE) 1 MG tablet Take 1 mg by mouth daily.    [provider]  ibuprofen (ADVIL) 200 MG tablet Take 800 mg by mouth every 6 (six) hours as needed for moderate pain.    [provider]  ondansetron (ZOFRAN) 4 MG tablet Take 1 tablet (4 mg total) by mouth daily as needed for nausea or vomiting. 04/18/20 04/18/21  Jadene Pierini, MD  oxyCODONE (OXY IR/ROXICODONE) 5 MG immediate release tablet Take 2 tablets (10 mg total) by mouth every 4 (four) hours as needed for severe pain. 04/18/20  Jadene Pierini, MD  rOPINIRole (REQUIP) 0.25 MG tablet Take 0.5 mg by mouth at bedtime. 03/08/20   [provider]  zolpidem (AMBIEN) 10 MG tablet Take 10 mg by mouth daily as needed for sleep. 03/12/20   [provider]    Allergies    Codeine, Erythromycin, Lidoderm [lidocaine], and Penicillins  Review of Systems   Review of Systems  Neurological: Positive for headaches.  All other systems reviewed and are negative.   Physical Exam Updated Vital Signs BP (!) 133/98 (BP Location: Left Arm)   Pulse 76    Temp 98.1 F (36.7 C) (Oral)   Resp 17   SpO2 100%   Physical Exam Vitals and nursing note reviewed.  Constitutional:      General: She is not in acute distress.    Appearance: She is well-developed and well-nourished.  HENT:     Head: Normocephalic and atraumatic.     Mouth/Throat:     Mouth: Oropharynx is clear and moist. Mucous membranes are moist.  Eyes:     General: No visual field deficit.    Extraocular Movements: Extraocular movements intact and EOM normal.     Conjunctiva/sclera: Conjunctivae normal.     Pupils: Pupils are equal, round, and reactive to light.  Cardiovascular:     Rate and Rhythm: Normal rate and regular rhythm.     Heart sounds: Normal heart sounds.  Pulmonary:     Effort: Pulmonary effort is normal. No respiratory distress.     Breath sounds: Normal breath sounds.  Abdominal:     General: There is no distension.     Palpations: Abdomen is soft.     Tenderness: There is no abdominal tenderness.  Musculoskeletal:        General: No deformity or edema. Normal range of motion.     Cervical back: Normal range of motion and neck supple.  Skin:    General: Skin is warm and dry.  Neurological:     Mental Status: She is alert and oriented to person, place, and time. Mental status is at baseline.     GCS: GCS eye subscore is 4. GCS verbal subscore is 5. GCS motor subscore is 6.     Cranial Nerves: No cranial nerve deficit, dysarthria or facial asymmetry.     Sensory: No sensory deficit.     Motor: No weakness.     Comments: Operative site to the right posterior scalp is clean dry intact. No overlying erythema or evidence of wound infection.  Psychiatric:        Mood and Affect: Mood and affect normal.     ED Results / Procedures / Treatments   Labs (all labs ordered are listed, but only abnormal results are displayed) Labs Reviewed - No data to display  EKG None  Radiology No results found.  Procedures Procedures   Medications Ordered in  ED Medications  oxyCODONE-acetaminophen (PERCOCET/ROXICET) 5-325 MG per tablet 1 tablet (1 tablet Oral Given 05/11/20 1735)    ED Course  I have reviewed the triage vital signs and the nursing notes.  Pertinent labs & imaging results that were available during my care of the patient were reviewed by me and considered in my medical decision making (see chart for details).    MDM Rules/Calculators/A&P                          MDM  Screen complete  Diana Ibarra was evaluated in  Emergency Department on 05/11/2020 for the symptoms described in the history of present illness. She was evaluated in the context of the global COVID-19 pandemic, which necessitated consideration that the patient might be at risk for infection with the SARS-CoV-2 virus that causes COVID-19. Institutional protocols and algorithms that pertain to the evaluation of patients at risk for COVID-19 are in a state of rapid change based on information released by regulatory bodies including the CDC and federal and state organizations. These policies and algorithms were followed during the patient's care in the ED.  Patient is presenting with complaint of persistent trigeminal neuralgia pain despite recent operative intervention with Dr. Johnsie Cancel.  Case discussed with covering neurosurgery -- Dr. Maisie Fus. Neurosurgery is in agreement with outpatient management. Patient does not require additional imaging or work-up in the ED. It would be appropriate for ED to prescribe a small amount of oxycodone to cover the patient until the next appointment on Wednesday with Dr. Johnsie Cancel.    Final Clinical Impression(s) / ED Diagnoses Final diagnoses:  Medication refill    Rx / DC Orders ED Discharge Orders         Ordered    oxyCODONE-acetaminophen (PERCOCET/ROXICET) 5-325 MG tablet  Every 6 hours PRN        05/11/20 1730           Wynetta Fines, MD 05/11/20 1752

## 2020-05-11 NOTE — ED Triage Notes (Signed)
Patient reports craniotomy on 1/4. C/o pain to right side of head since that time that has been uncontrolled x1 week after being out of oxycodone.

## 2020-05-13 ENCOUNTER — Emergency Department (HOSPITAL_COMMUNITY)
Admission: EM | Admit: 2020-05-13 | Discharge: 2020-05-13 | Discharge status: Against Medical Advice | Payer: BC Managed Care – PPO

## 2020-08-10 ENCOUNTER — Emergency Department: Payer: BC Managed Care – PPO

## 2020-08-10 ENCOUNTER — Emergency Department
Admission: EM | Admit: 2020-08-10 | Discharge: 2020-08-10 | Disposition: A | Discharge status: Home / Self Care | Payer: BC Managed Care – PPO | Attending: Emergency Medicine | Admitting: Emergency Medicine

## 2020-08-10 ENCOUNTER — Encounter: Payer: Self-pay | Admitting: Emergency Medicine

## 2020-08-10 ENCOUNTER — Other Ambulatory Visit: Payer: Self-pay

## 2020-08-10 DIAGNOSIS — R220 Localized swelling, mass and lump, head: Secondary | ICD-10-CM | POA: Diagnosis present

## 2020-08-10 DIAGNOSIS — F17210 Nicotine dependence, cigarettes, uncomplicated: Secondary | ICD-10-CM | POA: Insufficient documentation

## 2020-08-10 DIAGNOSIS — G96198 Other disorders of meninges, not elsewhere classified: Secondary | ICD-10-CM | POA: Insufficient documentation

## 2020-08-10 DIAGNOSIS — G509 Disorder of trigeminal nerve, unspecified: Secondary | ICD-10-CM

## 2020-08-10 DIAGNOSIS — G500 Trigeminal neuralgia: Secondary | ICD-10-CM | POA: Diagnosis not present

## 2020-08-10 DIAGNOSIS — Z9889 Other specified postprocedural states: Secondary | ICD-10-CM | POA: Diagnosis not present

## 2020-08-10 LAB — BASIC METABOLIC PANEL
Anion gap: 9 (ref 5–15)
BUN: 12 mg/dL (ref 6–20)
CO2: 22 mmol/L (ref 22–32)
Calcium: 9.6 mg/dL (ref 8.9–10.3)
Chloride: 105 mmol/L (ref 98–111)
Creatinine, Ser: 0.66 mg/dL (ref 0.44–1.00)
GFR, Estimated: >60 mL/min (ref >60)
Glucose, Bld: 95 mg/dL (ref 70–99)
Potassium: 4.3 mmol/L (ref 3.5–5.1)
Sodium: 136 mmol/L (ref 135–145)

## 2020-08-10 LAB — CBC WITH DIFFERENTIAL/PLATELET
Abs Immature Granulocytes: 0.01 10*3/uL (ref 0.00–0.07)
Basophils Absolute: 0 10*3/uL (ref 0.0–0.1)
Basophils Relative: 0 %
Eosinophils Absolute: 0.1 10*3/uL (ref 0.0–0.5)
Eosinophils Relative: 2 %
HCT: 41 % (ref 36.0–46.0)
Hemoglobin: 13.9 g/dL (ref 12.0–15.0)
Immature Granulocytes: 0 %
Lymphocytes Relative: 37 %
Lymphs Abs: 2.3 10*3/uL (ref 0.7–4.0)
MCH: 30.8 pg (ref 26.0–34.0)
MCHC: 33.9 g/dL (ref 30.0–36.0)
MCV: 90.9 fL (ref 80.0–100.0)
Monocytes Absolute: 0.3 10*3/uL (ref 0.1–1.0)
Monocytes Relative: 5 %
Neutro Abs: 3.4 10*3/uL (ref 1.7–7.7)
Neutrophils Relative %: 56 %
Platelets: 274 10*3/uL (ref 150–400)
RBC: 4.51 MIL/uL (ref 3.87–5.11)
RDW: 13 % (ref 11.5–15.5)
WBC: 6.2 10*3/uL (ref 4.0–10.5)
nRBC: 0 % (ref 0.0–0.2)

## 2020-08-10 MED ORDER — IOHEXOL 300 MG/ML  SOLN
75.0000 mL | Freq: Once | INTRAMUSCULAR | Status: AC | PRN
  Administered 2020-08-10: 75 mL via INTRAVENOUS

## 2020-08-10 MED ORDER — OXYCODONE HCL 5 MG PO TABS: 7.5000 mg | ORAL_TABLET | Freq: Four times a day (QID) | ORAL | 0 refills | Status: DC | PRN

## 2020-08-10 MED ORDER — OXYCODONE HCL 5 MG PO TABS
7.5000 mg | ORAL_TABLET | Freq: Once | ORAL | Status: AC
  Administered 2020-08-10: 7.5 mg via ORAL
  Filled 2020-08-10: qty 2

## 2020-08-10 MED ORDER — OXYCODONE HCL 5 MG PO TABS
7.5000 mg | ORAL_TABLET | Freq: Four times a day (QID) | ORAL | 0 refills | Status: AC | PRN
Start: 2020-08-10 — End: 2020-08-13

## 2020-08-10 NOTE — ED Triage Notes (Signed)
Pt reports had surgery in January on her brain and since then has had a knot on the back of her head that is now growing in size and painful.

## 2020-08-10 NOTE — ED Provider Notes (Signed)
[facility] Emergency Department Provider Note  ____________________________________________   Event Date/Time   First MD Initiated Contact with Patient 08/10/20 [phone]     (approximate)  I have reviewed the triage vital signs and the nursing notes.   HISTORY  Chief Complaint knot on head   HPI Diana Ibarra is a 35 y.o. female with a past medical history of anxiety and trigeminal neuralgia status post right-sided craniectomy and microvascular decompression by Dr. Johnsie Cancel in January of this year who presents for assessment of some persistent and worsening pain over the surgical site as well as some worsening swelling over the last couple weeks.  Patient states she has talked to her doctor several times and was seen last month by him and was told swelling and pain should improve.  She states she is prescribed oxycodone 5 mg which she has been taking has been recommended but her pain has not improved and the swelling is gotten worse over the last week.  She states she still has some decrease sensation over her right face and feels like she has some disequilibrium but has been present since before the surgery when she is ambulating but no other new weakness numbness in her visual changes.  No recent trauma or injuries.  She is not a blood thinners.  No fevers, chills, chest pain, cough, shortness of breath, Donnell pain, back pain, vomiting, diarrhea, dysuria, rash or extremity weakness numbness or tingling.  No other acute complaints at this time.  She states she talk to her doctor yesterday who advised her to get a second opinion and she has an appointment this coming week on 5/4 with feels she cannot wait that long because of the pain.  No other acute concerns at this time.         Past Medical History:  Diagnosis Date  . Anxiety   . Cat scratch fever   . Complication of anesthesia   . PONV (postoperative nausea and vomiting)   . Trigeminal neuralgia      Patient Active Problem List   Diagnosis Date Noted  . Trigeminal neuralgia 04/15/2020    Past Surgical History:  Procedure Laterality Date  . ABDOMINAL HYSTERECTOMY    . CHOLECYSTECTOMY    . CRANIECTOMY Right 04/15/2020   Procedure: Right repeat retrosigmoid craniotomy and microvascular decompression of fifth nerve;  Surgeon: Jadene Pierini, MD;  Location: [facility] OR;  Service: Neurosurgery;  Laterality: Right;    Prior to Admission medications   Medication Sig Start Date End Date Taking? Authorizing Provider  ALPRAZolam Prudy Feeler) 1 MG tablet Take 1 mg by mouth 3 (three) times daily as needed for anxiety. 04/02/20   [provider]  carbamazepine (TEGRETOL-XR) 100 MG 12 hr tablet Take 1 tablet (100 mg total) by mouth 2 (two) times daily. 03/08/20   Benjiman Core, MD  estradiol (ESTRACE) 1 MG tablet Take 1 mg by mouth daily.    [provider]  ibuprofen (ADVIL) 200 MG tablet Take 800 mg by mouth every 6 (six) hours as needed for moderate pain.    [provider]  ondansetron (ZOFRAN) 4 MG tablet Take 1 tablet (4 mg total) by mouth daily as needed for nausea or vomiting. 04/18/20 04/18/21  Jadene Pierini, MD  oxyCODONE (OXY IR/ROXICODONE) 5 MG immediate release tablet Take 1.5 tablets (7.5 mg total) by mouth every 6 (six) hours as needed for up to 3 days for severe pain. 08/10/20 08/13/20  Gilles Chiquito, MD  rOPINIRole (REQUIP) 0.25 MG tablet Take 0.5 mg by mouth at bedtime. 03/08/20   [provider]  zolpidem (AMBIEN) 10 MG tablet Take 10 mg by mouth daily as needed for sleep. 03/12/20   [provider]    Allergies Codeine, Erythromycin, Lidoderm [lidocaine], and Penicillins  Family History  Problem Relation Age of Onset  . Cancer Mother     Social History Social History   Tobacco Use  . Smoking status: Current Every Day Smoker    Packs/day: 0.15    Types: Cigarettes  . Smokeless tobacco: Never Used  Vaping Use  . Vaping  Use: Never used  Substance Use Topics  . Alcohol use: Never  . Drug use: Never    Review of Systems  Review of Systems  Constitutional: Negative for chills and fever.  HENT: Negative for sore throat.   Eyes: Negative for pain.  Respiratory: Negative for cough and stridor.   Cardiovascular: Negative for chest pain.  Gastrointestinal: Negative for vomiting.  Genitourinary: Negative for dysuria.  Musculoskeletal: Negative for myalgias.  Skin: Negative for rash.  Neurological: Positive for sensory change ( R face, chronic) and headaches. Negative for seizures and loss of consciousness.  Psychiatric/Behavioral: Negative for suicidal ideas.  All other systems reviewed and are negative.     ____________________________________________   PHYSICAL EXAM:  VITAL SIGNS: ED Triage Vitals [08/10/20 0952]  Enc Vitals Group     BP      Pulse      Resp      Temp      Temp src      SpO2      Weight 210 lb (95.3 kg)     Height 5\' 6"  (1.676 m)     Head Circumference      Peak Flow      Pain Score 7     Pain Loc      Pain Edu?      Excl. in GC?    Vitals:   08/10/20 0956 08/10/20 1200  BP: 134/90 118/80  Pulse: 99 67  Resp: 18   Temp: 98.1 F (36.7 C)   SpO2: 100% 98%   Physical Exam Vitals and nursing note reviewed.  Constitutional:      General: She is not in acute distress.    Appearance: She is well-developed.  HENT:     Head: Normocephalic and atraumatic.     Right Ear: External ear normal.     Left Ear: External ear normal.     Nose: Nose normal.     Mouth/Throat:     Mouth: Mucous membranes are moist.  Eyes:     Conjunctiva/sclera: Conjunctivae normal.  Cardiovascular:     Rate and Rhythm: Normal rate and regular rhythm.     Pulses: Normal pulses.     Heart sounds: No murmur heard.   Pulmonary:     Effort: Pulmonary effort is normal. No respiratory distress.     Breath sounds: Normal breath sounds.  Abdominal:     Palpations: Abdomen is soft.      Tenderness: There is no abdominal tenderness.  Musculoskeletal:     Cervical back: Neck supple.  Skin:    General: Skin is warm and dry.     Capillary Refill: Capillary refill takes less than 2 seconds.  Neurological:     Mental Status: She is alert and oriented to person, place, and time.  Psychiatric:        Mood and Affect: Mood normal.  Appearing older.  No finger dysmetria.  Patient has full and symmetric strength in her bilateral upper and lower extremities.  Sensation is intact to light touch in all extremities.  Cranial nerves II through XII are grossly intact with the exception of cranial nerve V distribution of V1 V2 and V3.  Patient has a palpable area of fluctuance approximately 3 cm circular in diameter over her right parietal scalp tracks a little bit inferior to her neck.  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  No intracranial abnormality or abnormal enhancement to suggest abscess or active bleeding hematoma.  Stable mastoid effusion.  Official radiology report(s): CT Head W or Wo Contrast  Result Date: 08/10/2020 CLINICAL DATA:  35 year old female with history of microvascular decompression for right side trigeminal neuralgia. Persistent pain. Painful palpable lump at the back of the head. EXAM: CT HEAD WITHOUT AND WITH CONTRAST TECHNIQUE: Contiguous axial images were obtained from the base of the skull through the vertex without and with intravenous contrast CONTRAST:  61mL OMNIPAQUE IOHEXOL 300 MG/ML  SOLN COMPARISON:  Postoperative brain and face MRI 04/03/2020. FINDINGS: Brain: Normal cerebral volume.  No midline shift, ventriculomegaly, mass effect, evidence of mass lesion, intracranial hemorrhage or evidence of cortically based acute infarction. Gray-white matter differentiation is within normal limits throughout the brain. However, there is dystrophic calcification suspected in the right cerebellopontine angle corresponding to the area of right 5th nerve decompression. No convincing encephalomalacia. No convincing abnormal enhancement following contrast. Vascular: The major intracranial vascular structures are enhancing and appear to be patent including the right sigmoid sinus. Skull: Sequelae of right suboccipital craniotomy. No acute osseous abnormality identified. Sinuses/Orbits: Mild right mastoid effusion is stable since December. Mild mucosal thickening in the posterior right maxillary and left sphenoid sinuses is new. But other paranasal sinuses, tympanic cavities, and mastoids are clear. Other: Simple fluid density oval fluid collection overlies the right suboccipital craniotomy, new since December and most suggestive of CSF pseudomeningocele. This encompasses 31 x 47 x 49 mm (AP by transverse by CC) no enhancement following contrast. No regional inflammation. Other orbit and scalp soft tissues appear negative. IMPRESSION: 1. 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 2. Nodular dystrophic calcification suspected at the site of previous right 5th nerve decompression. No convincing No acute intracranial abnormality or abnormal intracranial enhancement. 3. Stable mild right mastoid effusion, significance doubtful. Electronically Signed   By: Odessa Fleming M.D.   On: 08/10/2020 11:37   CT Soft Tissue Neck W Contrast  Result Date: 08/10/2020 CLINICAL DATA:  35 year old female with history of microvascular decompression for right side trigeminal neuralgia. Persistent pain. Painful palpable lump at the back of the head. EXAM: CT NECK WITH CONTRAST  TECHNIQUE: Multidetector CT imaging of the neck was performed using the standard protocol following the bolus administration of intravenous contrast. CONTRAST:  76mL OMNIPAQUE IOHEXOL 300 MG/ML SOLN in conjunction with contrast enhanced imaging of the brain reported separately. COMPARISON:  Head CT without and with contrast today reported separately. FINDINGS: Pharynx and larynx: Negative. Parapharyngeal and retropharyngeal spaces are normal. Salivary glands: Negative sublingual space. Submandibular and parotid glands are within normal limits. Thyroid: Negative. Lymph nodes: No cervical lymphadenopathy. Round suspected CSF pseudomeningocele overlying the right  suboccipital craniotomy is detailed on the head CT today separately, and felt to correspond to the palpable abnormality. No regional inflammation. Vascular: Major vascular structures in the neck and at the skull base are patent and appear normal. The right transverse and sigmoid sinuses are patent. Limited intracranial: Reported separately today. Visualized orbits: Negative. Mastoids and visualized paranasal sinuses: Minor mucosal thickening right maxillary and left sphenoid sinus. Small chronic right mastoid effusion. Skeleton: Largely absent dentition. Previous right suboccipital craniotomy. No acute osseous abnormality identified. Upper chest: Aberrant origin of the right subclavian artery, normal variant. Otherwise negative visible upper chest. IMPRESSION: 1. Suspected CSF pseudomeningocele overlying the right suboccipital craniotomy, see Head CT today reported separately. 2. Otherwise negative CT appearance of the Neck. 3. Aberrant origin right subclavian artery. Electronically Signed   By: Odessa FlemingH  Hall M.D.   On: 08/10/2020 11:42    ____________________________________________   PROCEDURES  Procedure(s) performed (including Critical Care):  Procedures   ____________________________________________   INITIAL IMPRESSION / ASSESSMENT AND PLAN /  ED COURSE      Patient presents with above-stated history exam for assessment of some persistent pain and swelling trips got worse for the last week or so on her right parietal scalp where she had a surgery in January for trigeminal neuralgia.  She still has some numbness in her right face and some intermittent vertigo.  She is afebrile and hemodynamically stable.  With exception of decree sensation to the right face she is otherwise neurologically intact.  She does have a palpable fluctuant mass over her right scalp.  It does track inferior upper posterior C-spine.  Is not meningitic.  Differential includes enlarging seroma, meningocele, hematoma or abscess.  No overlying skin changes including erythema induration warmth history of fever or leukocytosis on CBC to suggest acute infectious process.  However will obtain CT to assess for any extension into skull or evidence of infection of surrounding tissues.   BMP is unremarkable.  CBC is unremarkable.  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  No intracranial abnormality or abnormal enhancement to suggest abscess or active bleeding hematoma.  Stable mastoid effusion.  Discussed with on-call neurosurgeon Dr. Marcell BarlowYarborough who recommended given no emergent findings on history or exam patient [place]  Given stable vitals with otherwise low suspicion for hemorrhage acute infection or other immediate life-threatening process I think this is reasonable.  Patient given 3 days of slightly increased dose of analgesia.  Advised to follow-up with her neurosurgeon.  Discharged stable condition.       ____________________________________________   FINAL CLINICAL IMPRESSION(S) / ED DIAGNOSES  Final diagnoses:  Pseudomeningocele  Trigeminal neuropathy    Medications  oxyCODONE (Oxy IR/ROXICODONE) immediate release tablet 7.5 mg (7.5 mg Oral Given 08/10/20 1030)  iohexol  (OMNIPAQUE) 300 MG/ML solution 75 mL (75 mLs Intravenous Contrast Given 08/10/20 1113)     ED Discharge Orders         Ordered    oxyCODONE (OXY IR/ROXICODONE) 5 MG immediate release tablet  Every 6 hours PRN        08/10/20 1205           Note:  This document was prepared using Dragon voice recognition software and may include unintentional dictation errors.   Gilles ChiquitoSmith, Bralyn Espino P, MD 08/10/20 1215

## 2020-08-10 NOTE — ED Notes (Signed)
Pt has been provided with discharge instructions. Pt denies any questions or concerns at this time. Pt verbalizes understanding for follow up care and d/c.  VSS.  Pt left department with all belongings.  

## 2020-08-10 NOTE — ED Notes (Signed)
EDP at bedside at this time to assess patient. 

## 2020-08-10 NOTE — ED Notes (Signed)
Pt to ED via POV, states hx of trigeminal neuralgia. Pt states had surgery for the trigeminal neuralgia to fix the pain, however continues to have pain, also c/o lump on the back of her head that is painful to touch and is growing in size. Pt with knot noted on the back of her head under the surgical incision. Pt states has appt with neurosurgeon on Wednesday. Pt c/o increased pain to the touch of the knot to the back of her head at this time.

## 2020-10-02 ENCOUNTER — Other Ambulatory Visit: Payer: Self-pay | Admitting: Neurological Surgery

## 2020-10-14 NOTE — Pre-Procedure Instructions (Signed)
Surgical Instructions    Your procedure is scheduled on Friday July 8th.  Report to [facility] Main Entrance "A" at 05:30 A.M., then check in with the Admitting office.  Call this number if you have problems the morning of surgery:  [phone]   If you have any questions prior to your surgery date call [phone]: Open Monday-Friday 8am-4pm    Remember:  Do not eat after midnight the night before your surgery  You may drink clear liquids until 04:30 A.M. the morning of your surgery.   Clear liquids allowed are: Water, Non-Citrus Juices (without pulp), Carbonated Beverages, Clear Tea, Black Coffee Only, and Gatorade    Take these medicines the morning of surgery with A SIP OF WATER   carbamazepine (TEGRETOL-XR)  estradiol (ESTRACE)  oxyCODONE (OXY IR/ROXICODONE)- If needed  As of today, STOP taking any Aspirin (unless otherwise instructed by your surgeon) Aleve, Naproxen, Ibuprofen, Motrin, Advil, Goody's, BC's, all herbal medications, fish oil, and all vitamins.                     Do NOT Smoke (Tobacco/Vaping) or drink Alcohol 24 hours prior to your procedure.  If you use a CPAP at night, you may bring all equipment for your overnight stay.   Contacts, glasses, piercing's, hearing aid's, dentures or partials may not be worn into surgery, please bring cases for these belongings.    For patients admitted to the hospital, discharge time will be determined by your treatment team.   Patients discharged the day of surgery will not be allowed to drive home, and someone needs to stay with them for 24 hours.  ONLY 1 SUPPORT PERSON MAY BE PRESENT WHILE YOU ARE IN SURGERY. IF YOU ARE TO BE ADMITTED ONCE YOU ARE IN YOUR ROOM YOU WILL BE ALLOWED TWO (2) VISITORS.  Minor children may have two parents present. Special consideration for safety and communication needs will be reviewed on a case by case basis.   Special instructions:   [facility]- Preparing For Surgery  Before surgery,  you can play an important role. Because skin is not sterile, your skin needs to be as free of germs as possible. You can reduce the number of germs on your skin by washing with CHG (chlorahexidine gluconate) Soap before surgery.  CHG is an antiseptic cleaner which kills germs and bonds with the skin to continue killing germs even after washing.    Oral Hygiene is also important to reduce your risk of infection.  Remember - BRUSH YOUR TEETH THE MORNING OF SURGERY WITH YOUR REGULAR TOOTHPASTE  Please do not use if you have an allergy to CHG or antibacterial soaps. If your skin becomes reddened/irritated stop using the CHG.  Do not shave (including legs and underarms) for at least 48 hours prior to first CHG shower. It is OK to shave your face.  Please follow these instructions carefully.   Shower the NIGHT BEFORE SURGERY and the MORNING OF SURGERY  If you chose to wash your hair, wash your hair first as usual with your normal shampoo.  After you shampoo, rinse your hair and body thoroughly to remove the shampoo.  Use CHG Soap as you would any other liquid soap. You can apply CHG directly to the skin and wash gently with a scrungie or a clean washcloth.   Apply the CHG Soap to your body ONLY FROM THE NECK DOWN.  Do not use on open wounds or open sores. Avoid contact with your  eyes, ears, mouth and genitals (private parts). Wash Face and genitals (private parts)  with your normal soap.   Wash thoroughly, paying special attention to the area where your surgery will be performed.  Thoroughly rinse your body with warm water from the neck down.  DO NOT shower/wash with your normal soap after using and rinsing off the CHG Soap.  Pat yourself dry with a CLEAN TOWEL.  Wear CLEAN PAJAMAS to bed the night before surgery  Place CLEAN SHEETS on your bed the night before your surgery  DO NOT SLEEP WITH PETS.   Day of Surgery: Shower with CHG soap. Do not wear jewelry, make up, nail polish, gel  polish, artificial nails, or any other type of covering on natural nails including finger and toenails. If patients have artificial nails, gel coating, etc. that need to be removed by a nail salon please have this removed prior to surgery. Surgery may need to be canceled/delayed if the surgeon/ anesthesia feels like the patient is unable to be adequately monitored. Do not wear lotions, powders, perfumes/colognes, or deodorant. Do not shave 48 hours prior to surgery.  Men may shave face and neck. Do not bring valuables to the hospital. [facility] is not responsible for any belongings or valuables. Wear Clean/Comfortable clothing the morning of surgery Remember to brush your teeth WITH YOUR REGULAR TOOTHPASTE.   Please read over the following fact sheets that you were given.

## 2020-10-15 ENCOUNTER — Other Ambulatory Visit: Payer: Self-pay

## 2020-10-15 ENCOUNTER — Encounter (HOSPITAL_COMMUNITY): Payer: Self-pay

## 2020-10-15 ENCOUNTER — Encounter (HOSPITAL_COMMUNITY)
Admission: RE | Admit: 2020-10-15 | Discharge: 2020-10-15 | Disposition: A | Discharge status: Home / Self Care | Payer: BC Managed Care – PPO | Source: Ambulatory Visit | Attending: Neurological Surgery | Admitting: Neurological Surgery

## 2020-10-15 DIAGNOSIS — Z6834 Body mass index (BMI) 34.0-34.9, adult: Secondary | ICD-10-CM | POA: Insufficient documentation

## 2020-10-15 DIAGNOSIS — Z791 Long term (current) use of non-steroidal anti-inflammatories (NSAID): Secondary | ICD-10-CM | POA: Insufficient documentation

## 2020-10-15 DIAGNOSIS — G96198 Other disorders of meninges, not elsewhere classified: Secondary | ICD-10-CM | POA: Insufficient documentation

## 2020-10-15 DIAGNOSIS — Z20822 Contact with and (suspected) exposure to covid-19: Secondary | ICD-10-CM | POA: Insufficient documentation

## 2020-10-15 DIAGNOSIS — Z79891 Long term (current) use of opiate analgesic: Secondary | ICD-10-CM | POA: Insufficient documentation

## 2020-10-15 DIAGNOSIS — R7303 Prediabetes: Secondary | ICD-10-CM | POA: Insufficient documentation

## 2020-10-15 DIAGNOSIS — Z79890 Hormone replacement therapy (postmenopausal): Secondary | ICD-10-CM | POA: Insufficient documentation

## 2020-10-15 DIAGNOSIS — E669 Obesity, unspecified: Secondary | ICD-10-CM | POA: Insufficient documentation

## 2020-10-15 DIAGNOSIS — F419 Anxiety disorder, unspecified: Secondary | ICD-10-CM | POA: Insufficient documentation

## 2020-10-15 DIAGNOSIS — Z01812 Encounter for preprocedural laboratory examination: Secondary | ICD-10-CM | POA: Insufficient documentation

## 2020-10-15 DIAGNOSIS — Z87891 Personal history of nicotine dependence: Secondary | ICD-10-CM | POA: Insufficient documentation

## 2020-10-15 DIAGNOSIS — G43909 Migraine, unspecified, not intractable, without status migrainosus: Secondary | ICD-10-CM | POA: Insufficient documentation

## 2020-10-15 HISTORY — DX: Headache, unspecified: R51.9

## 2020-10-15 HISTORY — DX: Prediabetes: R73.03

## 2020-10-15 HISTORY — DX: Personal history of other diseases of the digestive system: Z87.19

## 2020-10-15 LAB — TYPE AND SCREEN
ABO/RH(D): O POS
Antibody Screen: NEGATIVE

## 2020-10-15 LAB — SURGICAL PCR SCREEN
MRSA, PCR: NEGATIVE
Staphylococcus aureus: NEGATIVE

## 2020-10-15 LAB — SARS CORONAVIRUS 2 (TAT 6-24 HRS): SARS Coronavirus 2: NEGATIVE

## 2020-10-15 LAB — BASIC METABOLIC PANEL
Anion gap: 8 (ref 5–15)
BUN: 8 mg/dL (ref 6–20)
CO2: 25 mmol/L (ref 22–32)
Calcium: 9.3 mg/dL (ref 8.9–10.3)
Chloride: 104 mmol/L (ref 98–111)
Creatinine, Ser: 0.81 mg/dL (ref 0.44–1.00)
GFR, Estimated: >60 mL/min (ref >60)
Glucose, Bld: 93 mg/dL (ref 70–99)
Potassium: 3.7 mmol/L (ref 3.5–5.1)
Sodium: 137 mmol/L (ref 135–145)

## 2020-10-15 LAB — HEMOGLOBIN A1C
Hgb A1c MFr Bld: 5.7 % — ABNORMAL HIGH (ref 4.8–5.6)
Mean Plasma Glucose: 116.89 mg/dL

## 2020-10-15 LAB — CBC
HCT: 36.9 % (ref 36.0–46.0)
Hemoglobin: 12.2 g/dL (ref 12.0–15.0)
MCH: 30.6 pg (ref 26.0–34.0)
MCHC: 33.1 g/dL (ref 30.0–36.0)
MCV: 92.5 fL (ref 80.0–100.0)
Platelets: 279 10*3/uL (ref 150–400)
RBC: 3.99 MIL/uL (ref 3.87–5.11)
RDW: 12.2 % (ref 11.5–15.5)
WBC: 6.4 10*3/uL (ref 4.0–10.5)
nRBC: 0 % (ref 0.0–0.2)

## 2020-10-15 NOTE — Progress Notes (Signed)
PCP - Per pt, she has not yet established a PCP since moving from [place] October 2021; formerly Gerlene Burdock, DO w/ [facility], [place]ica[place], [place] Cardiologist - Denies  PPM/ICD - Denies  Chest x-ray - N/A EKG - N/A Stress Test - Denies ECHO - Denies Cardiac Cath - Denies  Sleep Study - Denies  Pt states she is pre-diabetic. A1C obtained.  Blood Thinner Instructions: N/A Aspirin Instructions: N/A  ERAS Protcol - Yes PRE-SURGERY Ensure or G2- Not ordered  COVID TEST- 10/15/20   Anesthesia review: Yes, review last office note, labs from PCP  Patient denies shortness of breath, fever, cough and chest pain at PAT appointment   All instructions explained to the patient, with a verbal understanding of the material. Patient agrees to go over the instructions while at home for a better understanding. Patient also instructed to self quarantine after being tested for COVID-19. The opportunity to ask questions was provided.

## 2020-10-16 NOTE — Progress Notes (Signed)
Anesthesia Chart Review:  Case: [id number] Date/Time: 10/17/20 0715   Procedure: Lumboperitoneal shunt placement (Right)   Anesthesia type: General   Pre-op diagnosis: Pseudomeningocele   [facility] ROOM 18 / [facility] OR   Surgeons: Jadene Pierini, MD       DISCUSSION: Patient is a 35 year old female scheduled for the above procedure. S/p right craniotomy for 5th cranial nerve decompression 04/15/20. She presented to the ED on 08/10/20 with concerns for a palpable fluctuant mass over her right scalp with some right facial numbness. 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 Neurosurgery was contacted and out-patient follow-up arranged.   History includes smoking, post-operative N/V, right trigeminal neuralgia (s/p decompression ~ 2009; s/p right repeat retrosigmoid craniotomy, microvascular decompression 5th cranial nerve 04/15/20), pre-diabetes, hiatal hernia, migraines, anxiety, cat scratch fever, hysterectomy (~ age 73 years old), cholecystectomy. BMI is consistent with obesity.   10/15/2020 presurgical COVID-19 test negative.  Anesthesia team to evaluate on the day of surgery.   VS: BP 115/71   Pulse 72   Temp 36.8 C (Oral)   Resp 18   Ht 5\' 6"  (1.676 m)   Wt 96.1 kg   SpO2 100%   BMI 34.20 kg/m   PROVIDERS: She has not yet established a PCP since moving to [place] from in October 2021; formerly November 2021, DO with [facility] in [place], [place]   LABS: Labs reviewed: Acceptable for surgery. A1c 5.7 (up from 5.4% on 08/01/19 at [facility]). (all labs ordered are listed, but only abnormal results are displayed)  Labs Reviewed  HEMOGLOBIN A1C - Abnormal; Notable for the following components:      Result Value   Hgb A1c MFr Bld 5.7 (*)    All other components within normal limits  SURGICAL PCR SCREEN  SARS CORONAVIRUS 2 (TAT 6-24 HRS)  BASIC METABOLIC PANEL  CBC  TYPE AND SCREEN     IMAGES: CT Head  08/10/20: IMPRESSION: 1. 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 2. Nodular dystrophic calcification suspected at the site of previous right 5th nerve decompression. No convincing No acute intracranial abnormality or abnormal intracranial enhancement. 3. Stable mild right mastoid effusion, significance doubtful.   EKG: N/A   CV: N/a  Past Medical History:  Diagnosis Date   Anxiety    Cat scratch fever    Complication of anesthesia    Headache    migraines   History of hiatal hernia    PONV (postoperative nausea and vomiting)    Pre-diabetes    Trigeminal neuralgia     Past Surgical History:  Procedure Laterality Date   ABDOMINAL HYSTERECTOMY     CHOLECYSTECTOMY     CRANIECTOMY Right 04/15/2020   Procedure: Right repeat retrosigmoid craniotomy and microvascular decompression of fifth nerve;  Surgeon: 06/13/2020, MD;  Location: [facility] OR;  Service: Neurosurgery;  Laterality: Right;   lymph node removal Right    right upper arm    MEDICATIONS:  carbamazepine (TEGRETOL-XR) 100 MG 12 hr tablet   estradiol (ESTRACE) 1 MG tablet   ibuprofen (ADVIL) 200 MG tablet   oxyCODONE (OXY IR/ROXICODONE) 5 MG immediate release tablet   No current facility-administered medications for this encounter.    Jadene Pierini, PA-C Surgical Short Stay/Anesthesiology [facility] Phone [phone] [facility] Phone [phone] 10/16/2020 10:17 AM

## 2020-10-16 NOTE — Anesthesia Preprocedure Evaluation (Addendum)
Anesthesia Evaluation  Patient identified by MRN, date of birth, ID band Patient awake    Reviewed: Allergy & Precautions, NPO status , Patient's Chart, lab work & pertinent test results  History of Anesthesia Complications (+) PONV and history of anesthetic complications  Airway Mallampati: II  TM Distance: >3 FB Neck ROM: Full    Dental  (+) Dental Advisory Given, Partial Lower, Upper Dentures   Pulmonary Current Smoker and Patient abstained from smoking.,    Pulmonary exam normal        Cardiovascular negative cardio ROS Normal cardiovascular exam     Neuro/Psych  Headaches, PSYCHIATRIC DISORDERS Anxiety    GI/Hepatic Neg liver ROS, hiatal hernia,   Endo/Other   Obesity Pre-DM   Renal/GU negative Renal ROS     Musculoskeletal negative musculoskeletal ROS (+)   Abdominal   Peds  Hematology negative hematology ROS (+)   Anesthesia Other Findings Covid test negative   Reproductive/Obstetrics                           Anesthesia Physical Anesthesia Plan  ASA: 2  Anesthesia Plan: General   Post-op Pain Management:    Induction: Intravenous  PONV Risk Score and Plan: 3 and Treatment may vary due to age or medical condition, Ondansetron, Aprepitant, Dexamethasone and Midazolam  Airway Management Planned: Oral ETT  Additional Equipment: None  Intra-op Plan:   Post-operative Plan: Extubation in OR  Informed Consent: I have reviewed the patients History and Physical, chart, labs and discussed the procedure including the risks, benefits and alternatives for the proposed anesthesia with the patient or authorized representative who has indicated his/her understanding and acceptance.     Dental advisory given  Plan Discussed with: CRNA and Anesthesiologist  Anesthesia Plan Comments: ( )      Anesthesia Quick Evaluation

## 2020-10-17 ENCOUNTER — Encounter (HOSPITAL_COMMUNITY): Payer: Self-pay | Admitting: Neurological Surgery

## 2020-10-17 ENCOUNTER — Ambulatory Visit (HOSPITAL_COMMUNITY): Payer: BC Managed Care – PPO | Admitting: Vascular Surgery

## 2020-10-17 ENCOUNTER — Ambulatory Visit (HOSPITAL_COMMUNITY): Payer: BC Managed Care – PPO

## 2020-10-17 ENCOUNTER — Other Ambulatory Visit: Payer: Self-pay

## 2020-10-17 ENCOUNTER — Inpatient Hospital Stay (HOSPITAL_COMMUNITY)
Admission: RE | Admit: 2020-10-17 | Discharge: 2020-10-18 | DRG: 030 | Disposition: A | Discharge status: Home / Self Care | Payer: BC Managed Care – PPO | Attending: Neurological Surgery | Admitting: Neurological Surgery

## 2020-10-17 ENCOUNTER — Encounter (HOSPITAL_COMMUNITY)
Admission: RE | Disposition: A | Discharge status: Home / Self Care | Payer: Self-pay | Source: Home / Self Care | Attending: Neurological Surgery

## 2020-10-17 ENCOUNTER — Ambulatory Visit (HOSPITAL_COMMUNITY): Payer: BC Managed Care – PPO | Admitting: Anesthesiology

## 2020-10-17 DIAGNOSIS — Z809 Family history of malignant neoplasm, unspecified: Secondary | ICD-10-CM

## 2020-10-17 DIAGNOSIS — F17210 Nicotine dependence, cigarettes, uncomplicated: Secondary | ICD-10-CM | POA: Diagnosis present

## 2020-10-17 DIAGNOSIS — F419 Anxiety disorder, unspecified: Secondary | ICD-10-CM | POA: Diagnosis present

## 2020-10-17 DIAGNOSIS — Z20822 Contact with and (suspected) exposure to covid-19: Secondary | ICD-10-CM | POA: Diagnosis present

## 2020-10-17 DIAGNOSIS — G96198 Other disorders of meninges, not elsewhere classified: Principal | ICD-10-CM | POA: Diagnosis present

## 2020-10-17 DIAGNOSIS — Z419 Encounter for procedure for purposes other than remedying health state, unspecified: Secondary | ICD-10-CM

## 2020-10-17 HISTORY — PX: VENTRICULOPERITONEAL SHUNT: SHX204

## 2020-10-17 LAB — GLUCOSE, CAPILLARY: Glucose-Capillary: 96 mg/dL (ref 70–99)

## 2020-10-17 SURGERY — SHUNT INSERTION VENTRICULAR-PERITONEAL
Anesthesia: General | Laterality: Right

## 2020-10-17 MED ORDER — 0.9 % SODIUM CHLORIDE (POUR BTL) OPTIME
TOPICAL | Status: DC | PRN
  Administered 2020-10-17: 1000 mL

## 2020-10-17 MED ORDER — CHLORHEXIDINE GLUCONATE CLOTH 2 % EX PADS: 6.0000 | MEDICATED_PAD | Freq: Once | CUTANEOUS | Status: DC

## 2020-10-17 MED ORDER — HYDROMORPHONE HCL 1 MG/ML IJ SOLN
INTRAMUSCULAR | Status: AC
  Filled 2020-10-17: qty 1

## 2020-10-17 MED ORDER — OXYCODONE HCL 5 MG PO TABS
5.0000 mg | ORAL_TABLET | ORAL | Status: DC | PRN
  Administered 2020-10-18: 10 mg via ORAL

## 2020-10-17 MED ORDER — MENTHOL 3 MG MT LOZG
1.0000 | LOZENGE | OROMUCOSAL | Status: DC | PRN
  Filled 2020-10-17: qty 9

## 2020-10-17 MED ORDER — FENTANYL CITRATE (PF) 100 MCG/2ML IJ SOLN
INTRAMUSCULAR | Status: AC
  Filled 2020-10-17: qty 2

## 2020-10-17 MED ORDER — SCOPOLAMINE 1 MG/3DAYS TD PT72
MEDICATED_PATCH | TRANSDERMAL | Status: AC
  Filled 2020-10-17: qty 1

## 2020-10-17 MED ORDER — CEFAZOLIN SODIUM-DEXTROSE 2-4 GM/100ML-% IV SOLN
2.0000 g | Freq: Three times a day (TID) | INTRAVENOUS | Status: AC
  Administered 2020-10-17 (×2): 2 g via INTRAVENOUS
  Filled 2020-10-17 (×2): qty 100

## 2020-10-17 MED ORDER — ALUM & MAG HYDROXIDE-SIMETH 200-200-20 MG/5ML PO SUSP: 30.0000 mL | Freq: Four times a day (QID) | ORAL | Status: DC | PRN

## 2020-10-17 MED ORDER — POLYETHYLENE GLYCOL 3350 17 G PO PACK: 17.0000 g | PACK | Freq: Every day | ORAL | Status: DC | PRN

## 2020-10-17 MED ORDER — MIDAZOLAM HCL 2 MG/2ML IJ SOLN
INTRAMUSCULAR | Status: DC | PRN
  Administered 2020-10-17: 2 mg via INTRAVENOUS

## 2020-10-17 MED ORDER — LIDOCAINE-EPINEPHRINE 1 %-1:100000 IJ SOLN
INTRAMUSCULAR | Status: DC | PRN
  Administered 2020-10-17: 20 mL

## 2020-10-17 MED ORDER — PROPOFOL 10 MG/ML IV BOLUS
INTRAVENOUS | Status: AC
  Filled 2020-10-17: qty 20

## 2020-10-17 MED ORDER — DOCUSATE SODIUM 100 MG PO CAPS
100.0000 mg | ORAL_CAPSULE | Freq: Two times a day (BID) | ORAL | Status: DC
  Administered 2020-10-17 – 2020-10-18 (×3): 100 mg via ORAL
  Filled 2020-10-17 (×3): qty 1

## 2020-10-17 MED ORDER — FENTANYL CITRATE (PF) 250 MCG/5ML IJ SOLN
INTRAMUSCULAR | Status: DC | PRN
  Administered 2020-10-17 (×10): 50 ug via INTRAVENOUS

## 2020-10-17 MED ORDER — FENTANYL CITRATE (PF) 250 MCG/5ML IJ SOLN
INTRAMUSCULAR | Status: AC
  Filled 2020-10-17: qty 5

## 2020-10-17 MED ORDER — ESTRADIOL 1 MG PO TABS
1.0000 mg | ORAL_TABLET | Freq: Every day | ORAL | Status: DC
  Administered 2020-10-18: 1 mg via ORAL
  Filled 2020-10-17 (×2): qty 1

## 2020-10-17 MED ORDER — EPHEDRINE 5 MG/ML INJ
INTRAVENOUS | Status: AC
  Filled 2020-10-17: qty 10

## 2020-10-17 MED ORDER — ACETAMINOPHEN 10 MG/ML IV SOLN
1000.0000 mg | Freq: Once | INTRAVENOUS | Status: AC
  Administered 2020-10-17: 1000 mg via INTRAVENOUS

## 2020-10-17 MED ORDER — CHLORHEXIDINE GLUCONATE 0.12 % MT SOLN
15.0000 mL | Freq: Once | OROMUCOSAL | Status: DC
  Filled 2020-10-17: qty 15

## 2020-10-17 MED ORDER — CARBAMAZEPINE ER 100 MG PO TB12
100.0000 mg | ORAL_TABLET | Freq: Two times a day (BID) | ORAL | Status: DC
  Administered 2020-10-17 – 2020-10-18 (×2): 100 mg via ORAL
  Filled 2020-10-17 (×4): qty 1

## 2020-10-17 MED ORDER — ONDANSETRON HCL 4 MG/2ML IJ SOLN
INTRAMUSCULAR | Status: AC
  Filled 2020-10-17: qty 2

## 2020-10-17 MED ORDER — DEXAMETHASONE SODIUM PHOSPHATE 10 MG/ML IJ SOLN
INTRAMUSCULAR | Status: DC | PRN
  Administered 2020-10-17: 10 mg via INTRAVENOUS

## 2020-10-17 MED ORDER — OXYCODONE HCL 5 MG PO TABS
ORAL_TABLET | ORAL | Status: AC
  Filled 2020-10-17: qty 1

## 2020-10-17 MED ORDER — ACETAMINOPHEN 10 MG/ML IV SOLN
INTRAVENOUS | Status: AC
  Filled 2020-10-17: qty 100

## 2020-10-17 MED ORDER — HYDROMORPHONE HCL 1 MG/ML IJ SOLN
1.0000 mg | INTRAMUSCULAR | Status: DC | PRN
  Administered 2020-10-17 (×3): 1 mg via INTRAVENOUS
  Filled 2020-10-17 (×3): qty 1

## 2020-10-17 MED ORDER — PROPOFOL 10 MG/ML IV BOLUS
INTRAVENOUS | Status: DC | PRN
  Administered 2020-10-17: 200 mg via INTRAVENOUS

## 2020-10-17 MED ORDER — SUGAMMADEX SODIUM 200 MG/2ML IV SOLN
INTRAVENOUS | Status: DC | PRN
  Administered 2020-10-17: 200 mg via INTRAVENOUS

## 2020-10-17 MED ORDER — DIPHENHYDRAMINE HCL 50 MG/ML IJ SOLN
INTRAMUSCULAR | Status: AC
  Filled 2020-10-17: qty 1

## 2020-10-17 MED ORDER — OXYCODONE HCL 5 MG PO TABS
10.0000 mg | ORAL_TABLET | ORAL | Status: DC | PRN
Start: 2020-10-17 — End: 2020-10-18
  Administered 2020-10-17 – 2020-10-18 (×4): 10 mg via ORAL
  Filled 2020-10-17 (×5): qty 2

## 2020-10-17 MED ORDER — APREPITANT 40 MG PO CAPS
40.0000 mg | ORAL_CAPSULE | Freq: Once | ORAL | Status: AC
  Administered 2020-10-17: 40 mg via ORAL
  Filled 2020-10-17: qty 1

## 2020-10-17 MED ORDER — DIPHENHYDRAMINE HCL 50 MG/ML IJ SOLN
INTRAMUSCULAR | Status: DC | PRN
  Administered 2020-10-17: 25 mg via INTRAVENOUS

## 2020-10-17 MED ORDER — ORAL CARE MOUTH RINSE: 15.0000 mL | Freq: Once | OROMUCOSAL | Status: DC

## 2020-10-17 MED ORDER — ACETAMINOPHEN 325 MG PO TABS
650.0000 mg | ORAL_TABLET | ORAL | Status: DC | PRN
  Administered 2020-10-17 – 2020-10-18 (×4): 650 mg via ORAL
  Filled 2020-10-17 (×4): qty 2

## 2020-10-17 MED ORDER — LIDOCAINE 2% (20 MG/ML) 5 ML SYRINGE
INTRAMUSCULAR | Status: DC | PRN
  Administered 2020-10-17: 60 mg via INTRAVENOUS

## 2020-10-17 MED ORDER — EPHEDRINE SULFATE-NACL 50-0.9 MG/10ML-% IV SOSY
PREFILLED_SYRINGE | INTRAVENOUS | Status: DC | PRN
  Administered 2020-10-17: 10 mg via INTRAVENOUS

## 2020-10-17 MED ORDER — CYCLOBENZAPRINE HCL 10 MG PO TABS
10.0000 mg | ORAL_TABLET | Freq: Three times a day (TID) | ORAL | Status: DC | PRN
  Administered 2020-10-17 – 2020-10-18 (×4): 10 mg via ORAL
  Filled 2020-10-17 (×4): qty 1

## 2020-10-17 MED ORDER — PROMETHAZINE HCL 25 MG/ML IJ SOLN: 6.2500 mg | INTRAMUSCULAR | Status: DC | PRN

## 2020-10-17 MED ORDER — LIDOCAINE-EPINEPHRINE 1 %-1:100000 IJ SOLN
INTRAMUSCULAR | Status: AC
  Filled 2020-10-17: qty 1

## 2020-10-17 MED ORDER — ACETAMINOPHEN 650 MG RE SUPP: 650.0000 mg | RECTAL | Status: DC | PRN

## 2020-10-17 MED ORDER — THROMBIN 5000 UNITS EX SOLR
CUTANEOUS | Status: AC
  Filled 2020-10-17: qty 5000

## 2020-10-17 MED ORDER — PHENOL 1.4 % MT LIQD: 1.0000 | OROMUCOSAL | Status: DC | PRN

## 2020-10-17 MED ORDER — VANCOMYCIN HCL IN DEXTROSE 1-5 GM/200ML-% IV SOLN
1000.0000 mg | INTRAVENOUS | Status: AC
  Administered 2020-10-17: 1000 mg via INTRAVENOUS
  Filled 2020-10-17: qty 200

## 2020-10-17 MED ORDER — ROCURONIUM BROMIDE 10 MG/ML (PF) SYRINGE
PREFILLED_SYRINGE | INTRAVENOUS | Status: DC | PRN
  Administered 2020-10-17: 20 mg via INTRAVENOUS
  Administered 2020-10-17: 50 mg via INTRAVENOUS

## 2020-10-17 MED ORDER — ONDANSETRON HCL 4 MG PO TABS: 4.0000 mg | ORAL_TABLET | Freq: Four times a day (QID) | ORAL | Status: DC | PRN

## 2020-10-17 MED ORDER — LIDOCAINE 2% (20 MG/ML) 5 ML SYRINGE
INTRAMUSCULAR | Status: AC
  Filled 2020-10-17: qty 10

## 2020-10-17 MED ORDER — FENTANYL CITRATE (PF) 100 MCG/2ML IJ SOLN
25.0000 ug | INTRAMUSCULAR | Status: DC | PRN
  Administered 2020-10-17 (×3): 50 ug via INTRAVENOUS

## 2020-10-17 MED ORDER — THROMBIN 5000 UNITS EX SOLR
OROMUCOSAL | Status: DC | PRN
  Administered 2020-10-17: 5 mL via TOPICAL

## 2020-10-17 MED ORDER — OXYCODONE HCL 5 MG PO TABS
5.0000 mg | ORAL_TABLET | Freq: Once | ORAL | Status: AC | PRN
  Administered 2020-10-17: 5 mg via ORAL

## 2020-10-17 MED ORDER — SODIUM CHLORIDE 0.9% FLUSH: 3.0000 mL | INTRAVENOUS | Status: DC | PRN

## 2020-10-17 MED ORDER — MIDAZOLAM HCL 2 MG/2ML IJ SOLN
INTRAMUSCULAR | Status: AC
  Filled 2020-10-17: qty 2

## 2020-10-17 MED ORDER — LACTATED RINGERS IV SOLN: INTRAVENOUS | Status: DC

## 2020-10-17 MED ORDER — BACITRACIN ZINC 500 UNIT/GM EX OINT
TOPICAL_OINTMENT | CUTANEOUS | Status: AC
  Filled 2020-10-17: qty 28.35

## 2020-10-17 MED ORDER — SODIUM CHLORIDE 0.9 % IV SOLN: 250.0000 mL | INTRAVENOUS | Status: DC

## 2020-10-17 MED ORDER — SCOPOLAMINE 1 MG/3DAYS TD PT72
MEDICATED_PATCH | TRANSDERMAL | Status: DC | PRN
  Administered 2020-10-17: 1 via TRANSDERMAL

## 2020-10-17 MED ORDER — ONDANSETRON HCL 4 MG/2ML IJ SOLN
4.0000 mg | Freq: Four times a day (QID) | INTRAMUSCULAR | Status: DC | PRN
  Administered 2020-10-17: 4 mg via INTRAVENOUS
  Filled 2020-10-17: qty 2

## 2020-10-17 MED ORDER — HYDROMORPHONE HCL 1 MG/ML IJ SOLN
0.2500 mg | INTRAMUSCULAR | Status: DC | PRN
Start: 2020-10-17 — End: 2020-10-17
  Administered 2020-10-17 (×4): 0.5 mg via INTRAVENOUS

## 2020-10-17 MED ORDER — OXYCODONE HCL 5 MG/5ML PO SOLN
5.0000 mg | Freq: Once | ORAL | Status: AC | PRN
Start: 2020-10-17 — End: 2020-10-17

## 2020-10-17 MED ORDER — ONDANSETRON HCL 4 MG/2ML IJ SOLN
INTRAMUSCULAR | Status: DC | PRN
  Administered 2020-10-17: 4 mg via INTRAVENOUS

## 2020-10-17 MED ORDER — ROCURONIUM BROMIDE 10 MG/ML (PF) SYRINGE
PREFILLED_SYRINGE | INTRAVENOUS | Status: AC
  Filled 2020-10-17: qty 20

## 2020-10-17 MED ORDER — SODIUM CHLORIDE 0.9% FLUSH
3.0000 mL | Freq: Two times a day (BID) | INTRAVENOUS | Status: DC
  Administered 2020-10-17 (×2): 3 mL via INTRAVENOUS

## 2020-10-17 MED ORDER — DEXAMETHASONE SODIUM PHOSPHATE 10 MG/ML IJ SOLN
INTRAMUSCULAR | Status: AC
  Filled 2020-10-17: qty 1

## 2020-10-17 SURGICAL SUPPLY — 55 items
BAG COUNTER SPONGE SURGICOUNT (BAG) ×2 IMPLANT
BLADE CLIPPER SURG (BLADE) IMPLANT
BLADE SURG 11 STRL SS (BLADE) IMPLANT
BOOT SUTURE AID YELLOW STND (SUTURE) ×2 IMPLANT
BUR ACORN 9.0 PRECISION (BURR) IMPLANT
CANISTER SUCT 3000ML PPV (MISCELLANEOUS) ×2 IMPLANT
CLIP RANEY DISP (INSTRUMENTS) IMPLANT
DECANTER SPIKE VIAL GLASS SM (MISCELLANEOUS) ×2 IMPLANT
DERMABOND ADVANCED (GAUZE/BANDAGES/DRESSINGS) ×3
DERMABOND ADVANCED .7 DNX12 (GAUZE/BANDAGES/DRESSINGS) ×3 IMPLANT
DRAPE HALF SHEET 40X57 (DRAPES) ×2 IMPLANT
DRAPE INCISE IOBAN 66X45 STRL (DRAPES) ×2 IMPLANT
DRAPE ORTHO SPLIT 77X108 STRL (DRAPES) ×2
DRAPE SURG ORHT 6 SPLT 77X108 (DRAPES) ×2 IMPLANT
DURAPREP 26ML APPLICATOR (WOUND CARE) ×2 IMPLANT
ELECT REM PT RETURN 9FT ADLT (ELECTROSURGICAL) ×2
ELECTRODE REM PT RTRN 9FT ADLT (ELECTROSURGICAL) ×1 IMPLANT
GAUZE 4X4 16PLY ~~LOC~~+RFID DBL (SPONGE) ×2 IMPLANT
GLOVE EXAM NITRILE XL STR (GLOVE) IMPLANT
GLOVE SURG LTX SZ7.5 (GLOVE) ×4 IMPLANT
GLOVE SURG UNDER LTX SZ7.5 (GLOVE) ×4 IMPLANT
GOWN STRL REUS W/ TWL LRG LVL3 (GOWN DISPOSABLE) ×2 IMPLANT
GOWN STRL REUS W/ TWL XL LVL3 (GOWN DISPOSABLE) IMPLANT
GOWN STRL REUS W/TWL 2XL LVL3 (GOWN DISPOSABLE) IMPLANT
GOWN STRL REUS W/TWL LRG LVL3 (GOWN DISPOSABLE) ×2
GOWN STRL REUS W/TWL XL LVL3 (GOWN DISPOSABLE)
HEMOSTAT POWDER KIT SURGIFOAM (HEMOSTASIS) ×2 IMPLANT
HEMOSTAT SURGICEL 2X14 (HEMOSTASIS) IMPLANT
KIT BASIN OR (CUSTOM PROCEDURE TRAY) ×2 IMPLANT
KIT TURNOVER KIT B (KITS) ×2 IMPLANT
MARKER SKIN DUAL TIP RULER LAB (MISCELLANEOUS) IMPLANT
NEEDLE HYPO 22GX1.5 SAFETY (NEEDLE) ×2 IMPLANT
NS IRRIG 1000ML POUR BTL (IV SOLUTION) ×2 IMPLANT
PACK LAMINECTOMY NEURO (CUSTOM PROCEDURE TRAY) ×2 IMPLANT
PAD ARMBOARD 7.5X6 YLW CONV (MISCELLANEOUS) ×4 IMPLANT
PASSER CATH 65CM DISP (NEUROSURGERY SUPPLIES) IMPLANT
SHEATH PERITONEAL INTRO 46 (SHEATH) ×2 IMPLANT
SHEATH PERITONEAL INTRO 61 (SHEATH) IMPLANT
SHUNT LUMBOPERITONEAL 84X14 (Shunt) ×2 IMPLANT
SPONGE INTESTINAL PEANUT (DISPOSABLE) ×2 IMPLANT
SPONGE T-LAP 4X18 ~~LOC~~+RFID (SPONGE) ×4 IMPLANT
STAPLER SKIN PROX WIDE 3.9 (STAPLE) ×2 IMPLANT
STRIP CLOSURE SKIN 1/2X4 (GAUZE/BANDAGES/DRESSINGS) IMPLANT
SUT ETHILON 3 0 FSL (SUTURE) IMPLANT
SUT MNCRL AB 4-0 PS2 18 (SUTURE) ×4 IMPLANT
SUT NURALON 4 0 TR CR/8 (SUTURE) IMPLANT
SUT SILK 0 TIES 10X30 (SUTURE) IMPLANT
SUT SILK 3 0 SH 30 (SUTURE) IMPLANT
SUT VIC AB 2-0 CP2 18 (SUTURE) ×4 IMPLANT
SUT VIC AB 3-0 SH 8-18 (SUTURE) ×4 IMPLANT
TOWEL GREEN STERILE (TOWEL DISPOSABLE) ×4 IMPLANT
TOWEL GREEN STERILE FF (TOWEL DISPOSABLE) ×2 IMPLANT
TUBE CONNECTING 12X1/4 (SUCTIONS) ×2 IMPLANT
UNDERPAD 30X36 HEAVY ABSORB (UNDERPADS AND DIAPERS) IMPLANT
WATER STERILE IRR 1000ML POUR (IV SOLUTION) ×2 IMPLANT

## 2020-10-17 NOTE — Anesthesia Procedure Notes (Signed)
Procedure Name: Intubation Date/Time: 10/17/2020 7:57 AM Performed by: Mariea Clonts, CRNA Pre-anesthesia Checklist: Patient identified, Emergency Drugs available, Suction available and Patient being monitored Patient Re-evaluated:Patient Re-evaluated prior to induction Oxygen Delivery Method: Circle System Utilized Preoxygenation: Pre-oxygenation with 100% oxygen Induction Type: IV induction Ventilation: Mask ventilation without difficulty Laryngoscope Size: Mac and 3 Grade View: Grade I Tube type: Oral Tube size: 7.5 mm Number of attempts: 1 Airway Equipment and Method: Stylet and Oral airway Placement Confirmation: ETT inserted through vocal cords under direct vision, positive ETCO2 and breath sounds checked- equal and bilateral Tube secured with: Tape Dental Injury: Teeth and Oropharynx as per pre-operative assessment

## 2020-10-17 NOTE — Op Note (Signed)
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 A repeat MVD / exploration unfortunately only brought short term relief. She developed a delayed pseudomeningocele a few months after surgery that did not respond to medications. 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 I therefore offered LPS placement given how much pain she was having from it. We also discussed that, if her pseudomeningocele was still prominent after the CSF loss from inserting the shunt, I would tap and drain it in the OR while she was under anesthesia, given her hyperpathy in this region. The procedure was discussed with the patient as well as risks, benefits, and alternatives and wished to proceed with surgery.   OPERATIVE DETAIL: The patient was taken to the operating room and placed on the OR table in the left lateral decubitus position. A formal time out was performed with two patient identifiers and confirmed the operative site. Anesthesia was induced by the anesthesia team. The operative sites were marked, hair was clipped with surgical clippers, the area was then prepped and draped in a sterile fashion.   The lumbar incision was created first in the midline. A Tuohy needle was used with fluoroscopic guidance to access the thecal sac and the catheter was passed without resistance. Opening pressure was mildly elevated. This was tunneled to the flank where the valve incision was created. A pocket was created for the valve, the valve was connected to the proximal catheter, and the tunneler was directed to the right lateral abdomen, where the  abdominal incision was created. A mini-laparotomy was created, the distal catheter was connected to the valve with confirmation of distal flow, and the distal catheter was passed into the peritoneal cavity without resistance.  All instrument and sponge counts were correct, the incisions were then closed in layers. The pseudomeningocele was still prominent, so the area was prepped and a needle was inserted superficially and the pseudomeningocele was drained. The patient was then returned to anesthesia for emergence. No apparent complications at the completion of the procedure.   EBL:  95mL   DRAINS: none  IMPLANTS: Medtronic LP shunt system with Strata valve set to 2.5   SPECIMENS: none   Jadene Pierini, MD 10/17/20 7:43 AM

## 2020-10-17 NOTE — Transfer of Care (Signed)
Immediate Anesthesia Transfer of Care Note  Patient: Diana Ibarra  Procedure(s) Performed: Lumboperitoneal shunt placement and drainage of psuedomeningocele right neck (Right)  Patient Location: PACU  Anesthesia Type:General  Level of Consciousness: awake, alert  and oriented  Airway & Oxygen Therapy: Patient Spontanous Breathing and Patient connected to nasal cannula oxygen  Post-op Assessment: Report given to RN, Post -op Vital signs reviewed and stable and Patient moving all extremities X 4  Post vital signs: Reviewed and stable  Last Vitals:  Vitals Value Taken Time  BP 132/92 10/17/20 1002  Temp    Pulse 101 10/17/20 1008  Resp 15 10/17/20 1008  SpO2 97 % 10/17/20 1008  Vitals shown include unvalidated device data.  Last Pain:  Vitals:   10/17/20 0626  TempSrc:   PainSc: 7       Patients Stated Pain Goal: 7 (10/17/20 0626)  Complications: No notable events documented.

## 2020-10-17 NOTE — Progress Notes (Signed)
Dr. Mal Amabile made aware of patient continue with pain no relief from fentanly see new orders

## 2020-10-17 NOTE — Anesthesia Postprocedure Evaluation (Signed)
Anesthesia Post Note  Patient: [name]  Procedure(s) Performed: Lumboperitoneal shunt placement and drainage of psuedomeningocele right neck (Right)     Patient location during evaluation: PACU Anesthesia Type: General Level of consciousness: awake and alert Pain management: pain level controlled Vital Signs Assessment: post-procedure vital signs reviewed and stable Respiratory status: spontaneous breathing, nonlabored ventilation and respiratory function stable Cardiovascular status: blood pressure returned to baseline and stable Postop Assessment: no apparent nausea or vomiting Anesthetic complications: no   No notable events documented.  Last Vitals:  Vitals:   10/17/20 1140 10/17/20 1156  BP: 116/81 (!) 139/96  Pulse:  94  Resp: 15 18  Temp: (!) 36.4 C 36.7 C  SpO2: 95% 90%    Last Pain:  Vitals:   10/17/20 1200  TempSrc:   PainSc: 6                  Beryle Lathe

## 2020-10-17 NOTE — H&P (Signed)
Surgical H&P Update  HPI: 35 y.o. woman with a history of TGN s/p MVD x2 w/ painful pseudomeningocele that has not resolved w/ medical treatment. She has been miserable from the pain, so I offered an LPS to help reduce it. It is still persistent and painful today. No changes in health since she was last seen.  PMHx:  Past Medical History:  Diagnosis Date   Anxiety    Cat scratch fever    Complication of anesthesia    Headache    migraines   History of hiatal hernia    PONV (postoperative nausea and vomiting)    Pre-diabetes    Trigeminal neuralgia    FamHx:  Family History  Problem Relation Age of Onset   Cancer Mother    SocHx:  reports that she has been smoking cigarettes. She has been smoking an average of 0.15 packs per day. She has never used smokeless tobacco. She reports that she does not drink alcohol and does not use drugs.  Physical Exam: Strength 5/5 x4, SILTx4  Assesment/Plan: 35 y.o. woman with TGN s/p MVD x2 w/ persistent symptomatic pseudomeningocele, here for LPS. Risks, benefits, and alternatives discussed and the patient would like to continue with surgery.  -OR today for LPS -3C post-op  Jadene Pierini, MD 10/17/20 7:36 AM

## 2020-10-17 NOTE — Brief Op Note (Signed)
10/17/2020  9:53 AM  PATIENT:  Diana Ibarra  35 y.o. female  PRE-OPERATIVE DIAGNOSIS:  Pseudomeningocele  POST-OPERATIVE DIAGNOSIS:  Pseudomeningocele  PROCEDURE:  Procedure(s): Lumboperitoneal shunt placement and drainage of psuedomeningocele right neck (Right)  SURGEON:  Surgeon(s) and Role:    * Jadene Pierini, MD - Primary  PHYSICIAN ASSISTANT:   ASSISTANTS: none   ANESTHESIA:   general  EBL:  50 mL   BLOOD ADMINISTERED:none  DRAINS: none   LOCAL MEDICATIONS USED:  LIDOCAINE   SPECIMEN:  No Specimen  DISPOSITION OF SPECIMEN:  N/A  COUNTS:  YES  TOURNIQUET:  * No tourniquets in log *  DICTATION: .Note written in EPIC  PLAN OF CARE: Admit to inpatient   PATIENT DISPOSITION:  PACU - hemodynamically stable.   Delay start of Pharmacological VTE agent (>24hrs) due to surgical blood loss or risk of bleeding: yes

## 2020-10-18 MED ORDER — OXYCODONE HCL 5 MG PO TABS: 5.0000 mg | ORAL_TABLET | ORAL | 0 refills | Status: DC | PRN

## 2020-10-18 NOTE — Evaluation (Addendum)
Occupational Therapy Evaluation and Discharge Patient Details Name: Diana Ibarra MRN: [id number] DOB: 1985-11-01 Today's Date: 10/18/2020    History of Present Illness This 35 yo female presented with symptomatic pseudomeningocele and now s/p right lumboperitoneal shunt placement. PHMx: TGN s/p MVD x2, anxiety, cat scratch fever   Clinical Impression   This 35 yo admitted with above presents to acute OT with all education completed and pt able to get up and move around without A. No further OT needs, we will sign off. No PT needs identified, made them aware.    Follow Up Recommendations  No OT follow up    Equipment Recommendations  None recommended by OT           Precautions / Restrictions Precautions Precautions: None Restrictions Weight Bearing Restrictions: No      Mobility Bed Mobility Overal bed mobility: Independent                  Transfers Overall transfer level: Independent                    Balance Overall balance assessment: Independent                                         ADL either performed or assessed with clinical judgement   ADL Overall ADL's : Modified independent--increased time. Can cross her legs to get to her feet, can sit<>stand from a lower surface (simulated for low toilet) without use of arms to help push up, is aware not to submerge incisions.                                             Vision Patient Visual Report: No change from baseline              Pertinent Vitals/Pain Pain Assessment: 0-10 Pain Score: 4  Pain Location: incisional Pain Descriptors / Indicators: Aching;Sore Pain Intervention(s): Limited activity within patient's tolerance;Premedicated before session     Hand Dominance Right   Extremity/Trunk Assessment Upper Extremity Assessment Upper Extremity Assessment: Overall WFL for tasks assessed           Communication  Communication Communication: No difficulties   Cognition Arousal/Alertness: Awake/alert Behavior During Therapy: WFL for tasks assessed/performed Overall Cognitive Status: Within Functional Limits for tasks assessed                                                Home Living Family/patient expects to be discharged to:: Private residence Living Arrangements: Spouse/significant other Available Help at Discharge: Family               Bathroom Shower/Tub: Chief Strategy Officer: Handicapped height         Additional Comments: 2 kids      Prior Functioning/Environment Level of Independence: Independent                 OT Problem List: Pain         OT Goals(Current goals can be found in the care plan section) Acute Rehab OT Goals Patient Stated Goal: to go home today  AM-PAC OT "6 Clicks" Daily Activity     Outcome Measure Help from another person eating meals?: None Help from another person taking care of personal grooming?: None Help from another person toileting, which includes using toliet, bedpan, or urinal?: None Help from another person bathing (including washing, rinsing, drying)?: None Help from another person to put on and taking off regular upper body clothing?: None Help from another person to put on and taking off regular lower body clothing?: None 6 Click Score: 24   End of Session    Activity Tolerance: Patient tolerated treatment well Patient left:  (sitting EOB)  OT Visit Diagnosis: Pain Pain - part of body:  (incisional)                Time: 1308-6578 OT Time Calculation (min): 9 min Charges:  OT General Charges $OT Visit: 1 Visit OT Evaluation $OT Eval Low Complexity: 1 Low Ignacia Palma, OTR/L Acute [employer] Pager [phone] Office [phone]    Evette Georges 10/18/2020, 8:44 AM

## 2020-10-18 NOTE — Discharge Summary (Signed)
Physician Discharge Summary  Patient ID: Diana Ibarra MRN: [id number] DOB/AGE: 1985-07-05 35 y.o.  Admit date: 10/17/2020 Discharge date: 10/18/2020  Admission Diagnoses: pseudomeningocele    Discharge Diagnoses: same   Discharged Condition: good  Hospital Course: The patient was admitted on 10/17/2020 and taken to the operating room where the patient underwent LP shunt. The patient tolerated the procedure well and was taken to the recovery room and then to the floor in stable condition. The hospital course was routine. There were no complications. The wound remained clean dry and intact. Pt had appropriate back soreness. No complaints of leg pain or new N/T/W. The patient remained afebrile with stable vital signs, and tolerated a regular diet. The patient continued to increase activities, and pain was well controlled with oral pain medications.   Consults: None  Significant Diagnostic Studies:  Results for orders placed or performed during the hospital encounter of 10/17/20  Glucose, capillary  Result Value Ref Range   Glucose-Capillary 96 70 - 99 mg/dL    DG Lumbar Spine 2-3 Views  Result Date: 10/17/2020 CLINICAL DATA:  Surgery, elective. Additional history provided: Lumbar shunt. Provided fluoroscopy time 12 seconds (10.15 mGy). EXAM: LUMBAR SPINE - 2-3 VIEW; DG C-ARM 1-60 MIN COMPARISON:  No pertinent prior exams available for comparison. FINDINGS: A single lateral view intraoperative fluoroscopic images of the lumbosacral spine is submitted. The lowest well-formed intervertebral disc space is designated L5-S1. On the provided image, what appears to be a catheter projects over the lumbar spine at the L3-L4 level. IMPRESSION: Single lateral view intraoperative fluoroscopic images of the lumbosacral spine demonstrating what appears to be a catheter projecting over the lumbar spine at the L3-L4 level (presuming standard lumbar anatomy). Electronically Signed   By: Jackey Loge DO   On:  10/17/2020 08:59   DG C-Arm 1-60 Min  Result Date: 10/17/2020 CLINICAL DATA:  Surgery, elective. Additional history provided: Lumbar shunt. Provided fluoroscopy time 12 seconds (10.15 mGy). EXAM: LUMBAR SPINE - 2-3 VIEW; DG C-ARM 1-60 MIN COMPARISON:  No pertinent prior exams available for comparison. FINDINGS: A single lateral view intraoperative fluoroscopic images of the lumbosacral spine is submitted. The lowest well-formed intervertebral disc space is designated L5-S1. On the provided image, what appears to be a catheter projects over the lumbar spine at the L3-L4 level. IMPRESSION: Single lateral view intraoperative fluoroscopic images of the lumbosacral spine demonstrating what appears to be a catheter projecting over the lumbar spine at the L3-L4 level (presuming standard lumbar anatomy). Electronically Signed   By: Jackey Loge DO   On: 10/17/2020 08:59    Antibiotics:  Anti-infectives (From admission, onward)    Start     Dose/Rate Route Frequency Ordered Stop   10/17/20 1600  ceFAZolin (ANCEF) IVPB 2g/100 mL premix        2 g 200 mL/hr over 30 Minutes Intravenous Every 8 hours 10/17/20 1158 10/18/20 0000   10/17/20 0615  vancomycin (VANCOCIN) IVPB 1000 mg/200 mL premix        1,000 mg 200 mL/hr over 60 Minutes Intravenous On call to O.R. 10/17/20 0612 10/17/20 0858       Discharge Exam: Blood pressure 117/64, pulse 65, temperature 97.7 F (36.5 C), temperature source Oral, resp. rate 18, height 5\' 6"  (1.676 m), weight 95.7 kg, SpO2 100 %. Neurologic: Grossly normal Dressings dry  Discharge Medications:   Allergies as of 10/18/2020       Reactions   Codeine Itching   Erythromycin Hives   Lidoderm [lidocaine]  Itching   adhesive caused itching   Penicillins Hives, Itching   Tape Rash   Paper tape is ok        Medication List     TAKE these medications    carbamazepine 100 MG 12 hr tablet Commonly known as: TEGretol-XR Take 1 tablet (100 mg total) by mouth 2 (two)  times daily.   estradiol 1 MG tablet Commonly known as: ESTRACE Take 1 mg by mouth daily.   ibuprofen 200 MG tablet Commonly known as: ADVIL Take 800 mg by mouth every 6 (six) hours as needed for moderate pain.   oxyCODONE 5 MG immediate release tablet Commonly known as: Oxy IR/ROXICODONE Take 1 tablet (5 mg total) by mouth every 4 (four) hours as needed. What changed: reasons to take this        Disposition: home  Final Dx: LP shunt  Discharge Instructions      Remove dressing in 72 hours   Complete by: As directed    Call MD for:  difficulty breathing, headache or visual disturbances   Complete by: As directed    Call MD for:  persistant nausea and vomiting   Complete by: As directed    Call MD for:  redness, tenderness, or signs of infection (pain, swelling, redness, odor or green/yellow discharge around incision site)   Complete by: As directed    Call MD for:  severe uncontrolled pain   Complete by: As directed    Call MD for:  temperature >100.4   Complete by: As directed    Diet - low sodium heart healthy   Complete by: As directed    Increase activity slowly   Complete by: As directed           Signed: Tia Alert 10/18/2020, 9:12 AM

## 2020-10-18 NOTE — Progress Notes (Signed)
PT Cancellation Note  Patient Details Name: Diana Ibarra MRN: [id number] DOB: 12-17-1985   Cancelled Treatment:    Reason Eval/Treat Not Completed: PT screened, no needs identified, will sign off. OT reporting pt is functioning independently this date and will have 24/7 assistance at d/c. No PT services deemed necessary at this time, PT will sign off.   Raymond Gurney, PT, DPT Acute Rehabilitation Services  Pager: [phone] Office: [phone]    Diana Ibarra 10/18/2020, 8:42 AM

## 2020-10-18 NOTE — Progress Notes (Signed)
Patient was transported via wheelchair by RN for discharge home; in no acute distress nor complaints of pain or discomfort; all belongings checked and accounted for; discharge instructions given to patient by RN and she verbalized understanding on the instructions given.

## 2020-10-21 ENCOUNTER — Encounter (HOSPITAL_COMMUNITY): Payer: Self-pay | Admitting: Neurological Surgery

## 2021-01-14 ENCOUNTER — Emergency Department: Payer: BC Managed Care – PPO

## 2021-01-14 ENCOUNTER — Emergency Department
Admission: EM | Admit: 2021-01-14 | Discharge: 2021-01-14 | Disposition: A | Discharge status: Home / Self Care | Payer: BC Managed Care – PPO | Attending: Emergency Medicine | Admitting: Emergency Medicine

## 2021-01-14 ENCOUNTER — Other Ambulatory Visit: Payer: Self-pay

## 2021-01-14 DIAGNOSIS — B9689 Other specified bacterial agents as the cause of diseases classified elsewhere: Secondary | ICD-10-CM | POA: Diagnosis not present

## 2021-01-14 DIAGNOSIS — F17210 Nicotine dependence, cigarettes, uncomplicated: Secondary | ICD-10-CM | POA: Diagnosis not present

## 2021-01-14 DIAGNOSIS — R1031 Right lower quadrant pain: Secondary | ICD-10-CM | POA: Diagnosis present

## 2021-01-14 DIAGNOSIS — N390 Urinary tract infection, site not specified: Secondary | ICD-10-CM | POA: Diagnosis not present

## 2021-01-14 LAB — CBC WITH DIFFERENTIAL/PLATELET
Abs Immature Granulocytes: 0.01 10*3/uL (ref 0.00–0.07)
Basophils Absolute: 0 10*3/uL (ref 0.0–0.1)
Basophils Relative: 1 %
Eosinophils Absolute: 0.1 10*3/uL (ref 0.0–0.5)
Eosinophils Relative: 1 %
HCT: 38 % (ref 36.0–46.0)
Hemoglobin: 12.7 g/dL (ref 12.0–15.0)
Immature Granulocytes: 0 %
Lymphocytes Relative: 43 %
Lymphs Abs: 2.2 10*3/uL (ref 0.7–4.0)
MCH: 30.3 pg (ref 26.0–34.0)
MCHC: 33.4 g/dL (ref 30.0–36.0)
MCV: 90.7 fL (ref 80.0–100.0)
Monocytes Absolute: 0.3 10*3/uL (ref 0.1–1.0)
Monocytes Relative: 5 %
Neutro Abs: 2.5 10*3/uL (ref 1.7–7.7)
Neutrophils Relative %: 50 %
Platelets: 269 10*3/uL (ref 150–400)
RBC: 4.19 MIL/uL (ref 3.87–5.11)
RDW: 11.8 % (ref 11.5–15.5)
WBC: 5 10*3/uL (ref 4.0–10.5)
nRBC: 0 % (ref 0.0–0.2)

## 2021-01-14 LAB — URINALYSIS, COMPLETE (UACMP) WITH MICROSCOPIC
Bilirubin Urine: NEGATIVE
Glucose, UA: NEGATIVE mg/dL
Hgb urine dipstick: NEGATIVE
Ketones, ur: NEGATIVE mg/dL
Leukocytes,Ua: NEGATIVE
Nitrite: NEGATIVE
Protein, ur: NEGATIVE mg/dL
Specific Gravity, Urine: 1.017 (ref 1.005–1.030)
pH: 5 (ref 5.0–8.0)

## 2021-01-14 LAB — COMPREHENSIVE METABOLIC PANEL
ALT: 16 U/L (ref 0–44)
AST: 11 U/L — ABNORMAL LOW (ref 15–41)
Albumin: 4.1 g/dL (ref 3.5–5.0)
Alkaline Phosphatase: 72 U/L (ref 38–126)
Anion gap: 9 (ref 5–15)
BUN: 12 mg/dL (ref 6–20)
CO2: 24 mmol/L (ref 22–32)
Calcium: 9.6 mg/dL (ref 8.9–10.3)
Chloride: 105 mmol/L (ref 98–111)
Creatinine, Ser: 0.73 mg/dL (ref 0.44–1.00)
GFR, Estimated: >60 mL/min (ref >60)
Glucose, Bld: 96 mg/dL (ref 70–99)
Potassium: 4.2 mmol/L (ref 3.5–5.1)
Sodium: 138 mmol/L (ref 135–145)
Total Bilirubin: 0.6 mg/dL (ref 0.3–1.2)
Total Protein: 7.2 g/dL (ref 6.5–8.1)

## 2021-01-14 LAB — LIPASE, BLOOD: Lipase: 31 U/L (ref 11–51)

## 2021-01-14 LAB — POC URINE PREG, ED: Preg Test, Ur: NEGATIVE

## 2021-01-14 MED ORDER — SODIUM CHLORIDE 0.9 % IV SOLN
1.0000 g | Freq: Once | INTRAVENOUS | Status: DC
  Filled 2021-01-14: qty 10

## 2021-01-14 MED ORDER — KETOROLAC TROMETHAMINE 60 MG/2ML IM SOLN
30.0000 mg | Freq: Once | INTRAMUSCULAR | Status: AC
  Administered 2021-01-14: 30 mg via INTRAMUSCULAR

## 2021-01-14 MED ORDER — OXYCODONE-ACETAMINOPHEN 5-325 MG PO TABS: 1.0000 | ORAL_TABLET | ORAL | 0 refills | Status: DC | PRN

## 2021-01-14 MED ORDER — KETOROLAC TROMETHAMINE 30 MG/ML IJ SOLN
30.0000 mg | Freq: Once | INTRAMUSCULAR | Status: DC
  Filled 2021-01-14: qty 1

## 2021-01-14 MED ORDER — MORPHINE SULFATE (PF) 4 MG/ML IV SOLN
4.0000 mg | Freq: Once | INTRAVENOUS | Status: AC
Start: 2021-01-14 — End: 2021-01-14
  Administered 2021-01-14: 4 mg via INTRAVENOUS
  Filled 2021-01-14: qty 1

## 2021-01-14 MED ORDER — CEPHALEXIN 500 MG PO CAPS: 500.0000 mg | ORAL_CAPSULE | Freq: Two times a day (BID) | ORAL | 0 refills | Status: AC

## 2021-01-14 MED ORDER — CEPHALEXIN 500 MG PO CAPS
500.0000 mg | ORAL_CAPSULE | Freq: Once | ORAL | Status: AC
  Administered 2021-01-14: 500 mg via ORAL
  Filled 2021-01-14: qty 1

## 2021-01-14 MED ORDER — OXYCODONE-ACETAMINOPHEN 5-325 MG PO TABS
1.0000 | ORAL_TABLET | Freq: Once | ORAL | Status: AC
  Administered 2021-01-14: 1 via ORAL
  Filled 2021-01-14: qty 1

## 2021-01-14 MED ORDER — ONDANSETRON HCL 4 MG/2ML IJ SOLN
4.0000 mg | Freq: Once | INTRAMUSCULAR | Status: AC
  Administered 2021-01-14: 4 mg via INTRAVENOUS
  Filled 2021-01-14: qty 2

## 2021-01-14 MED ORDER — IOHEXOL 350 MG/ML SOLN
80.0000 mL | Freq: Once | INTRAVENOUS | Status: AC | PRN
  Administered 2021-01-14: 80 mL via INTRAVENOUS

## 2021-01-14 MED ORDER — ONDANSETRON 8 MG PO TBDP: 8.0000 mg | ORAL_TABLET | Freq: Three times a day (TID) | ORAL | 0 refills | Status: DC | PRN

## 2021-01-14 MED ORDER — OXYCODONE-ACETAMINOPHEN 5-325 MG PO TABS: 1.0000 | ORAL_TABLET | Freq: Three times a day (TID) | ORAL | 0 refills | Status: AC | PRN

## 2021-01-14 NOTE — ED Triage Notes (Signed)
Reports onset of RLQ last night, worsening overnight. +NVD. Denies CP or SOB.

## 2021-01-14 NOTE — ED Provider Notes (Signed)
[facility] Emergency Department Provider Note ____________________________________________   Event Date/Time   First MD Initiated Contact with Patient 01/14/21 [phone]     (approximate)  I have reviewed the triage vital signs and the nursing notes.   HISTORY  Chief Complaint Abdominal Pain    HPI Diana Ibarra is a 35 y.o. female with PMH as noted below including trigeminal neuralgia status post LP shunt who presents with right lower quadrant abdominal pain, gradual onset yesterday but then acutely worsened around 3:45 AM today.  She reports associated nausea and an episode of vomiting.  She denies any acute change in her bowel movements although reports chronic diarrhea.  She denies any dysuria or hematuria and has no vaginal bleeding or discharge; the patient is status post hysterectomy/oophorectomy as well as cholecystectomy.  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   Trigeminal neuralgia 04/15/2020    Past Surgical History:  Procedure Laterality Date   ABDOMINAL HYSTERECTOMY     CHOLECYSTECTOMY     CRANIECTOMY Right 04/15/2020   Procedure: Right repeat retrosigmoid craniotomy and microvascular decompression of fifth nerve;  Surgeon: Jadene Pierini, MD;  Location: [facility] OR;  Service: Neurosurgery;  Laterality: Right;   lymph node removal Right    right upper arm   VENTRICULOPERITONEAL SHUNT Right 10/17/2020   Procedure: Lumboperitoneal shunt placement and drainage of psuedomeningocele right neck;  Surgeon: Jadene Pierini, MD;  Location: [facility] OR;  Service: Neurosurgery;  Laterality: Right;    Prior to Admission medications   Medication Sig Start Date End  Date Taking? Authorizing Provider  cephALEXin (KEFLEX) 500 MG capsule Take 1 capsule (500 mg total) by mouth 2 (two) times daily for 10 days. 01/14/21 01/24/21 Yes Dionne Bucy, MD  ondansetron (ZOFRAN ODT) 8 MG disintegrating tablet Take 1 tablet (8 mg total) by mouth every 8 (eight) hours as needed for nausea or vomiting. 01/14/21  Yes Dionne Bucy, MD  oxyCODONE-acetaminophen (PERCOCET) 5-325 MG tablet Take 1 tablet by mouth every 8 (eight) hours as needed for up to 3 days for severe pain. 01/14/21 01/17/21 Yes Dionne Bucy, MD  carbamazepine (TEGRETOL-XR) 100 MG 12 hr tablet Take 1 tablet (100 mg total) by mouth 2 (two) times daily. 03/08/20   Benjiman Core, MD  estradiol (ESTRACE) 1 MG tablet Take 1 mg by mouth daily.    [provider]  ibuprofen (ADVIL) 200 MG tablet Take 800 mg by mouth every 6 (six) hours as needed for moderate pain.    [provider]  oxyCODONE (OXY IR/ROXICODONE) 5 MG immediate release tablet Take 1 tablet (5 mg total) by mouth every 4 (four) hours as needed. 10/18/20   Tia Alert, MD    Allergies Codeine, Erythromycin, Lidoderm [lidocaine], Penicillins, and Tape  Family History  Problem Relation Age of Onset   Cancer Mother     Social History Social History   Tobacco Use   Smoking status: Every Day    Packs/day: 0.15    Types: Cigarettes   Smokeless tobacco: Never  Vaping Use   Vaping Use: Every day  Substance Use Topics   Alcohol use: Never  Drug use: Never    Review of Systems  Constitutional: No fever/chills Eyes: No visual changes. ENT: No sore throat. Cardiovascular: Denies chest pain. Respiratory: Denies shortness of breath. Gastrointestinal: Positive for nausea and vomiting.   Genitourinary: Negative for dysuria.  Musculoskeletal: Negative for back pain. Skin: Negative for rash. Neurological: Negative for headaches, focal weakness or  numbness.   ____________________________________________   PHYSICAL EXAM:  VITAL SIGNS: ED Triage Vitals  Enc Vitals Group     BP 01/14/21 0642 (!) 131/95     Pulse Rate 01/14/21 0642 79     Resp 01/14/21 0642 18     Temp 01/14/21 0642 98.2 F (36.8 C)     Temp Source 01/14/21 0642 Oral     SpO2 01/14/21 0642 97 %     Weight 01/14/21 0642 210 lb 15.7 oz (95.7 kg)     Height 01/14/21 0642 5\' 6"  (1.676 m)     Head Circumference --      Peak Flow --      Pain Score 01/14/21 0642 6     Pain Loc --      Pain Edu? --      Excl. in GC? --     Constitutional: Alert and oriented.  Uncomfortable appearing but in no acute distress. Eyes: Conjunctivae are normal.  No scleral icterus. Head: Atraumatic. Nose: No congestion/rhinnorhea. Mouth/Throat: Mucous membranes are moist.   Neck: Normal range of motion.  Cardiovascular: Normal rate, regular rhythm. Good peripheral circulation. Respiratory: Normal respiratory effort.  No retractions. Gastrointestinal: Soft with moderate right lower quadrant tenderness.  No distention.  Genitourinary: No flank tenderness. Musculoskeletal: Extremities warm and well perfused.  Neurologic:  Normal speech and language. No gross focal neurologic deficits are appreciated.  Skin:  Skin is warm and dry. No rash noted. Psychiatric: Mood and affect are normal. Speech and behavior are normal.  ____________________________________________   LABS (all labs ordered are listed, but only abnormal results are displayed)  Labs Reviewed  COMPREHENSIVE METABOLIC PANEL - Abnormal; Notable for the following components:      Result Value   AST 11 (*)    All other components within normal limits  URINALYSIS, COMPLETE (UACMP) WITH MICROSCOPIC - Abnormal; Notable for the following components:   Color, Urine YELLOW (*)    APPearance HAZY (*)    Bacteria, UA MANY (*)    All other components within normal limits  CBC WITH DIFFERENTIAL/PLATELET  LIPASE, BLOOD  POC  URINE PREG, ED   ____________________________________________  EKG   ____________________________________________  RADIOLOGY  CT abdomen/pelvis:  IMPRESSION:  1. No acute findings are noted in the abdomen or pelvis to account  for the patient's symptoms. Specifically, the appendix is normal.  2. Mild aortic atherosclerosis.  3. Status post cholecystectomy.  4. Additional incidental findings, as above.    ____________________________________________   PROCEDURES  Procedure(s) performed: No  Procedures  Critical Care performed: No ____________________________________________   INITIAL IMPRESSION / ASSESSMENT AND PLAN / ED COURSE  Pertinent labs & imaging results that were available during my care of the patient were reviewed by me and considered in my medical decision making (see chart for details).   35 year old female with PMH as noted above presents with right lower quadrant pain since yesterday associated with nausea and vomiting.  She is status postcholecystectomy and hysterectomy/oophorectomy.  On exam the patient is well-appearing.  Her vital signs are normal.  The abdomen is soft with moderate tenderness in the right lower quadrant.  Differential includes  appendicitis, terminal ileitis, colitis, diverticulitis, SBO, ureteral stone, pyelonephritis.  We will obtain a lab work-up, urinalysis, CT abdomen/pelvis and reassess.  ----------------------------------------- 11:43 AM on 01/14/2021 -----------------------------------------  CT shows no evidence of appendicitis or other acute intra-abdominal process.  As above, the patient is status post hysterectomy with bilateral salpingo-oophorectomy so there is no possibility of ovarian torsion or other gynecologic etiology.  There is no evidence of any complication related to the LP shunt catheter, although the catheter tip is located in the right lower quadrant around where the patient is having pain.  Urinalysis shows  WBCs and bacteria, consistent with UTI/early pyelonephritis, which could be causing the patient's pain.  On reassessment, the patient has some continued pain but appears well.  Her vital signs are stable.  The lab work-up is otherwise within normal limits.  I consulted Dr. Myer Haff from neurosurgery given the patient's history of LP shunt and the location of the pain.  He advises that if there are no concerning findings on the CT he would treat for other likely causes of the patient's pain and he does not recommend any further ED work-up or intervention, but would have the patient follow-up with her neurosurgeon.  At this time, the patient is comfortable and would like to go home.  I counseled her extensively on the results of the work-up and the plan of care.  I will put her on a 10-day course of Keflex for likely UTI/early pyelonephritis.  She is tolerating p.o., has no evidence of sepsis, and no there is no indication for inpatient admission at this time.    The patient is on oxycodone 5 mg 3 times daily with meals due to chronic pain from her trigeminal neuralgia.  I reviewed her record and the PDMP and have prescribed an additional small quantity of Percocet for breakthrough pain over the next couple of days that she could take at night or between meals.  I instructed her to follow-up with her neurosurgeon Dr. Maurice Small as well as with her PMD.  I gave her thorough return precautions and she expresses understanding.  ____________________________________________   FINAL CLINICAL IMPRESSION(S) / ED DIAGNOSES  Final diagnoses:  Right lower quadrant pain  Urinary tract infection without hematuria, site unspecified      NEW MEDICATIONS STARTED DURING THIS VISIT:  Discharge Medication List as of 01/14/2021 11:43 AM     START taking these medications   Details  cephALEXin (KEFLEX) 500 MG capsule Take 1 capsule (500 mg total) by mouth 2 (two) times daily for 10 days., Starting Wed  01/14/2021, Until Sat 01/24/2021, Normal    ondansetron (ZOFRAN ODT) 8 MG disintegrating tablet Take 1 tablet (8 mg total) by mouth every 8 (eight) hours as needed for nausea or vomiting., Starting Wed 01/14/2021, Normal    oxyCODONE-acetaminophen (PERCOCET) 5-325 MG tablet Take 1 tablet by mouth every 8 (eight) hours as needed for up to 3 days for severe pain., Starting Wed 01/14/2021, Until Sat 01/17/2021 at 2359, Normal         Note:  This document was prepared using Dragon voice recognition software and may include unintentional dictation errors.    Dionne Bucy, MD 01/14/21 1150

## 2021-01-14 NOTE — Discharge Instructions (Addendum)
Your work-up suggests a urinary tract/kidney infection.  Take the antibiotic as prescribed and finish the full 10-day course.  You may take the percocet as needed for breakthrough pain and the Zofran for nausea.  Return to the ER immediately for new, worsening, or persistent severe pain, vomiting, fever, weakness, or any other new or worsening symptoms that concern you.  You should contact Dr. Maurice Small to arrange for follow-up given that the pain is in the area of the shunt, although the CT scan today does not show any evidence of a problem with the shunt.

## 2021-05-03 ENCOUNTER — Encounter (HOSPITAL_COMMUNITY): Payer: Self-pay

## 2021-05-03 ENCOUNTER — Emergency Department (HOSPITAL_COMMUNITY)
Admission: EM | Admit: 2021-05-03 | Discharge: 2021-05-03 | Disposition: A | Discharge status: Home / Self Care | Payer: Managed Care, Other (non HMO) | Attending: Emergency Medicine | Admitting: Emergency Medicine

## 2021-05-03 DIAGNOSIS — R519 Headache, unspecified: Secondary | ICD-10-CM

## 2021-05-03 DIAGNOSIS — G500 Trigeminal neuralgia: Secondary | ICD-10-CM | POA: Insufficient documentation

## 2021-05-03 DIAGNOSIS — F1190 Opioid use, unspecified, uncomplicated: Secondary | ICD-10-CM | POA: Insufficient documentation

## 2021-05-03 DIAGNOSIS — Z79891 Long term (current) use of opiate analgesic: Secondary | ICD-10-CM

## 2021-05-03 MED ORDER — ONDANSETRON HCL 4 MG/2ML IJ SOLN: 4.0000 mg | Freq: Once | INTRAMUSCULAR | Status: DC

## 2021-05-03 MED ORDER — HYDROMORPHONE HCL 1 MG/ML IJ SOLN
1.0000 mg | Freq: Once | INTRAMUSCULAR | Status: AC
  Administered 2021-05-03: 1 mg via INTRAMUSCULAR
  Filled 2021-05-03: qty 1

## 2021-05-03 MED ORDER — HYDROMORPHONE HCL 1 MG/ML IJ SOLN: 1.0000 mg | Freq: Once | INTRAMUSCULAR | Status: DC

## 2021-05-03 MED ORDER — ONDANSETRON 4 MG PO TBDP
4.0000 mg | ORAL_TABLET | Freq: Once | ORAL | Status: AC
  Administered 2021-05-03: 4 mg via ORAL
  Filled 2021-05-03: qty 1

## 2021-05-03 MED ORDER — OXYCODONE HCL 5 MG PO TABS: 5.0000 mg | ORAL_TABLET | ORAL | 0 refills | Status: DC | PRN

## 2021-05-03 NOTE — ED Triage Notes (Signed)
Pt arrived via POV, c/o trigeminal neuralgia flare up. Increased pain x4 days. Took oxycodone x1.5 hrs ago with no relief.

## 2021-05-03 NOTE — Discharge Instructions (Signed)
Please call your pain management provider tomorrow to let them know you are seen here in the emergency department.  As discussed in the room depending on your pain management contract any additional narcotic medications may be of your pain management contract.  Call them to discuss filling this short prescription was provided you.  Otherwise return for new or worsening symptoms.

## 2021-05-03 NOTE — ED Provider Notes (Signed)
[facility]   CSN: [id number] Arrival date & time: 05/03/21  [id number]    History  Facial Pain  Diana Ibarra is a 36 y.o. female followed by [facility] here for evaluation of right-sided facial pain.  States she has trigeminal neuralgia, on opiates daily.  States she feels like the weather has made her "flare".  Attempting her home p.o. meds without relief.  States this feels similar to her prior trigeminal neuralgia flares.  No sudden onset headache, new facial numbness, weakness, difficulty speaking.  No fever, neck pain.  Rates her pain a 10/10.  No facial rash.  HPI     Home Medications Prior to Admission medications   Medication Sig Start Date End Date Taking? Authorizing Provider  oxyCODONE (ROXICODONE) 5 MG immediate release tablet Take 1 tablet (5 mg total) by mouth every 4 (four) hours as needed for severe pain. 05/03/21  Yes Viviana Trimble A, PA-C  carbamazepine (TEGRETOL-XR) 100 MG 12 hr tablet Take 1 tablet (100 mg total) by mouth 2 (two) times daily. 03/08/20   Benjiman CorePickering, Nathan, MD  estradiol (ESTRACE) 1 MG tablet Take 1 mg by mouth daily.    [provider]  ibuprofen (ADVIL) 200 MG tablet Take 800 mg by mouth every 6 (six) hours as needed for moderate pain.    [provider]  ondansetron (ZOFRAN ODT) 8 MG disintegrating tablet Take 1 tablet (8 mg total) by mouth every 8 (eight) hours as needed for nausea or vomiting. 01/14/21   Dionne BucySiadecki, Sebastian, MD  oxyCODONE (OXY IR/ROXICODONE) 5 MG immediate release tablet Take 1 tablet (5 mg total) by mouth every 4 (four) hours as needed. 10/18/20   Tia AlertJones, David S, MD      Allergies    Codeine, Erythromycin, Lidoderm [lidocaine], Penicillins, and Tape    Review of Systems   Review of Systems  Constitutional: Negative.   HENT:         Right facial pain  Respiratory: Negative.    Cardiovascular: Negative.   Gastrointestinal:  Negative.   Musculoskeletal: Negative.   Skin: Negative.   Neurological: Negative.   All other systems reviewed and are negative.  Physical Exam Updated Vital Signs BP 123/86    Pulse 78    Temp 98.2 F (36.8 C) (Oral)    Resp 15    SpO2 96%  Physical Exam Physical Exam  Constitutional: Pt is oriented to person, place, and time. Pt appears well-developed and well-nourished. No distress.  HENT:  Head: Normocephalic and atraumatic. Tenderness to right face in CN 5 distribution Mouth/Throat: Oropharynx is clear and moist.  Eyes: Conjunctivae and EOM are normal. Pupils are equal, round, and reactive to light. No scleral icterus.  No horizontal, vertical or rotational nystagmus  Neck: Normal range of motion. Neck supple.  Full active and passive ROM without pain No midline or paraspinal tenderness No nuchal rigidity or meningeal signs  Cardiovascular: Normal rate, regular rhythm and intact distal pulses.   Pulmonary/Chest: Effort normal and breath sounds normal. No respiratory distress. Pt has no wheezes. No rales.  Abdominal: Soft. Bowel sounds are normal. There is no tenderness. There is no rebound and no guarding.  Musculoskeletal: Normal range of motion.  Lymphadenopathy:    No cervical adenopathy.  Neurological: Pt. is alert and oriented to person, place, and time. He has normal reflexes. No cranial nerve deficit.  Exhibits normal muscle tone. Coordination normal.  Mental Status:  Alert, oriented, thought content appropriate.  Speech fluent without evidence of aphasia. Able to follow 2 step commands without difficulty.  Cranial Nerves:  II:  Peripheral visual fields grossly normal, pupils equal, round, reactive to light III,IV, VI: ptosis not present, extra-ocular motions intact bilaterally  V,VII: smile symmetric, facial light touch sensation equal (tenderness to V5 distribution) VIII: hearing grossly normal bilaterally  IX,X: midline uvula rise  XI: bilateral shoulder shrug  equal and strong XII: midline tongue extension  Motor:  5/5 in upper and lower extremities bilaterally including strong and equal grip strength and dorsiflexion/plantar flexion Sensory: Pinprick and light touch normal in all extremities.  Deep Tendon Reflexes: 2+ and symmetric  Cerebellar: normal finger-to-nose with bilateral upper extremities Gait: normal gait and balance CV: distal pulses palpable throughout   Skin: Skin is warm and dry. No rash noted. Pt is not diaphoretic.  Psychiatric: Pt has a normal mood and affect. Behavior is normal. Judgment and thought content normal.  Nursing note and vitals reviewed.  ED Results / Procedures / Treatments   Labs (all labs ordered are listed, but only abnormal results are displayed) Labs Reviewed - No data to display  EKG None  Radiology No results found.  Procedures Procedures    Medications Ordered in ED Medications  HYDROmorphone (DILAUDID) injection 1 mg (1 mg Intramuscular Given 05/03/21 1038)  ondansetron (ZOFRAN-ODT) disintegrating tablet 4 mg (4 mg Oral Given 05/03/21 1037)   ED Course/ Medical Decision Making/ A&P    36 year old here for evaluation of right-sided facial pain consistent with her prior trigeminal neuralgia flares.  Recent injury or trauma.  She is nonfocal neuro exam without deficits aside from some tenderness in V5 distribution to the right side of her face.  No rash.  No facial droop.  Taking home pain meds without relief.  States she typically has "flares" with weather change.  No recent head trauma.  Exam not consistent with acute CVA, MS, dissection, giant cell arteritis.  Suspect acute on chronic pain.  We will hold on labs, imaging, will treat pain.  Did review her records from [facility] whom she follows with outpatient.  She is on outpatient narcotics at baseline.  Patient reassessed.  Pain improved, tolerating p.o. intake.  Suspect acute on chronic pain.  DC home symptomatic  management.  Did discuss narcotic pain medication given to her outside of her pain contract and encouraged her to follow-up with pain management to let her know she was seen here in the ED.  The patient has been appropriately medically screened and/or stabilized in the ED. I have low suspicion for any other emergent medical condition which would require further screening, evaluation or treatment in the ED or require inpatient management.  Patient is hemodynamically stable and in no acute distress.  Patient able to ambulate in department prior to ED.  Evaluation does not show acute pathology that would require ongoing or additional emergent interventions while in the emergency department or further inpatient treatment.  I have discussed the diagnosis with the patient and answered all questions.  Pain is been managed while in the emergency department and patient has no further complaints prior to discharge.  Patient is comfortable with plan discussed in room and is stable for discharge at this time.  I have discussed strict return precautions for returning to the emergency department.  Patient was encouraged to follow-up with PCP/specialist refer to at discharge.  Medical Decision Making Amount and/or Complexity of Data Reviewed External Data Reviewed: labs, radiology and notes.    Details: Admission for biopsy with [facility]. Pain managment notes with [facility]  Risk OTC drugs. Prescription drug management. Parenteral controlled substances.          Final Clinical Impression(s) / ED Diagnoses Final diagnoses:  Facial pain  Trigeminal neuralgia of right side of face  Chronic prescription opiate use    Rx / DC Orders ED Discharge Orders          Ordered    oxyCODONE (ROXICODONE) 5 MG immediate release tablet  Every 4 hours PRN        05/03/21 1118              Marne Meline A, PA-C 05/03/21 1133    Bethann Berkshire, MD 05/06/21 1009

## 2021-07-28 ENCOUNTER — Encounter: Payer: Managed Care, Other (non HMO) | Admitting: Obstetrics & Gynecology

## 2022-11-26 IMAGING — CT CT ABD-PELV W/ CM
2 of 4 series · 15 of 46 positions shown, 17 images · IV contrast (APPLIED)
Comparison: No priors.

CLINICAL DATA: 35-year-old female with history of right lower
quadrant abdominal pain since yesterday evening. Nausea, vomiting
and diarrhea.

EXAM:
CT ABDOMEN AND PELVIS WITH CONTRAST
TECHNIQUE: Multidetector CT imaging of the abdomen and pelvis was performed
using the standard protocol following bolus administration of
intravenous contrast.
CONTRAST:  80mL OMNIPAQUE IOHEXOL 350 MG/ML SOLN

[Series 2: routine abd/pel with · axial · 0.73mm/px · z∈[-538,-118]mm · 12 of 94 slices shown, 14 images]
[im 5/94  soft-tissue]
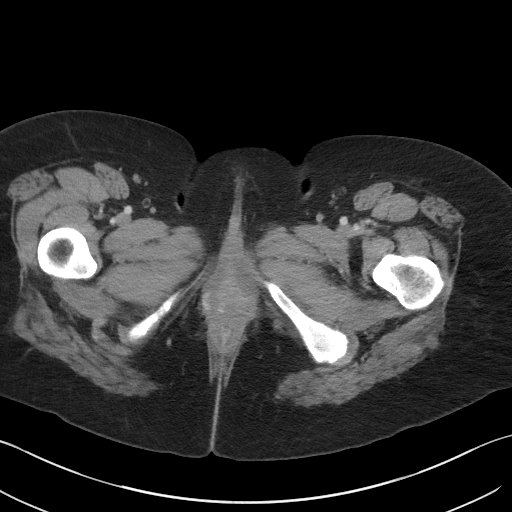
[im 5/94  bone]
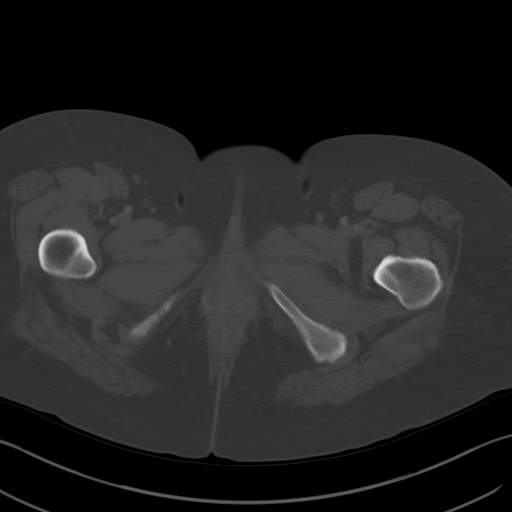
[im 13/94  soft-tissue]
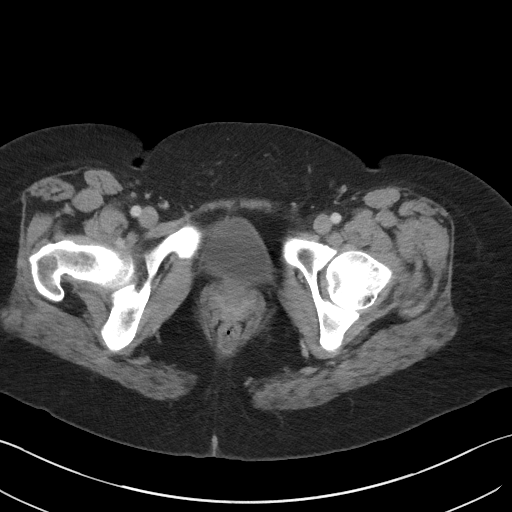
[im 21/94  soft-tissue]
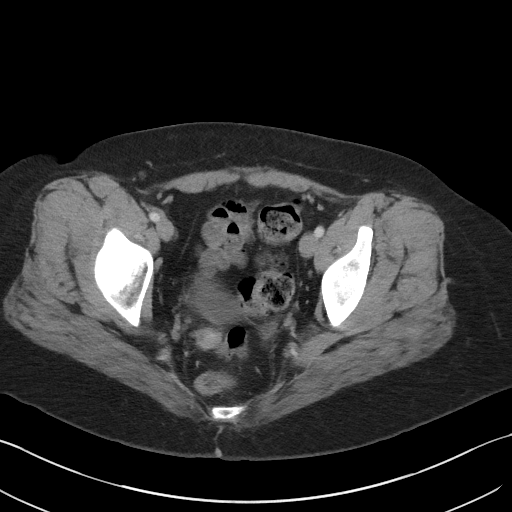
[im 29/94  soft-tissue]
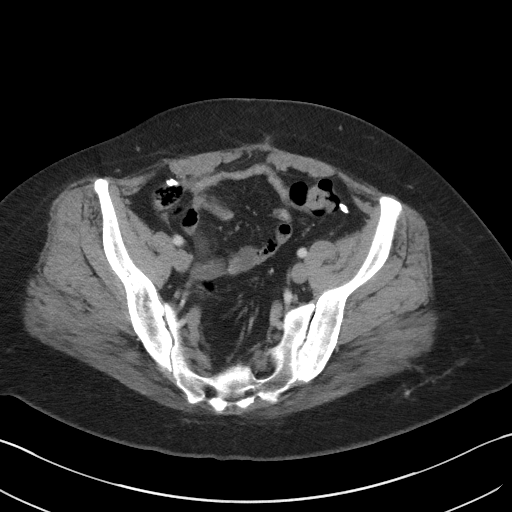
[im 37/94  soft-tissue]
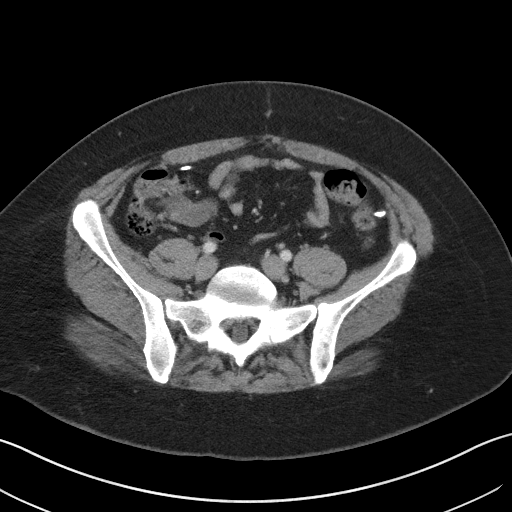
[im 45/94  soft-tissue]
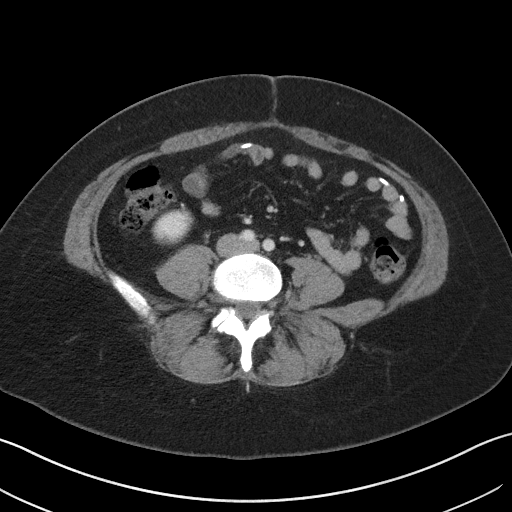
[im 49/94  soft-tissue]
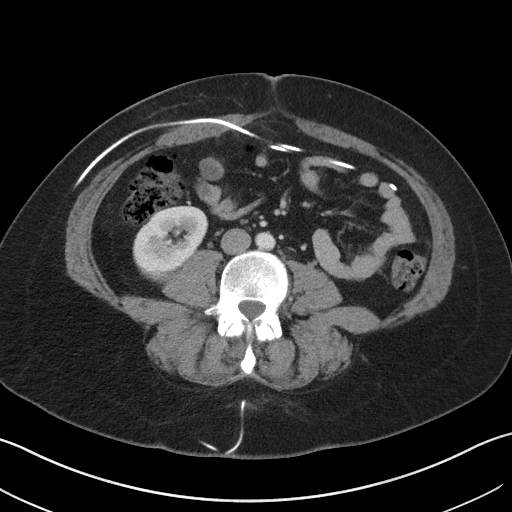
[im 57/94  soft-tissue]
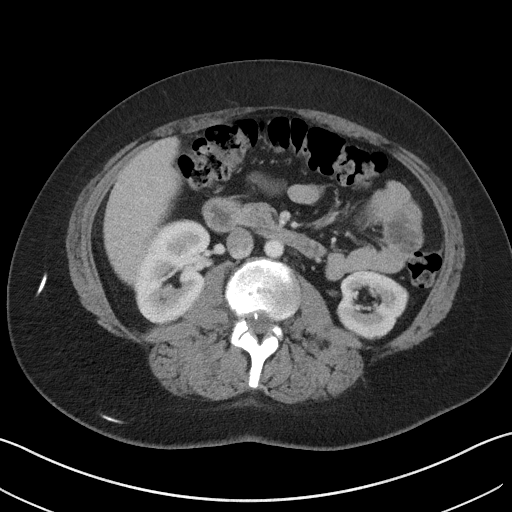
[im 65/94  soft-tissue]
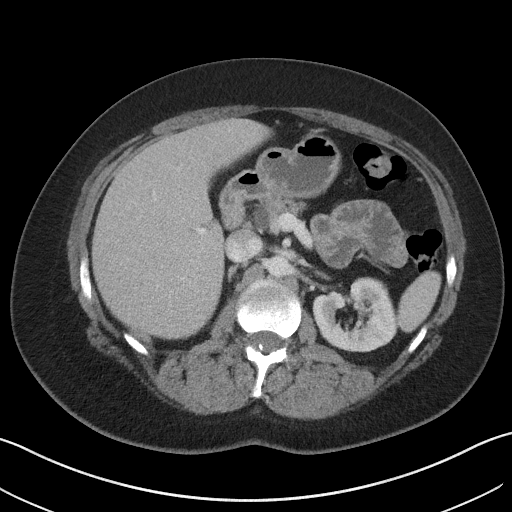
[im 65/94  bone]
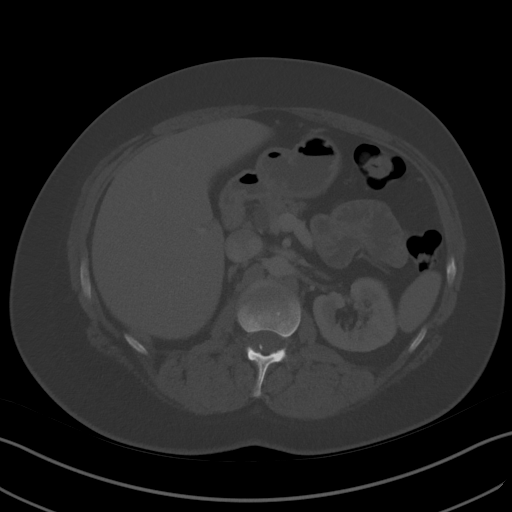
[im 73/94  soft-tissue]
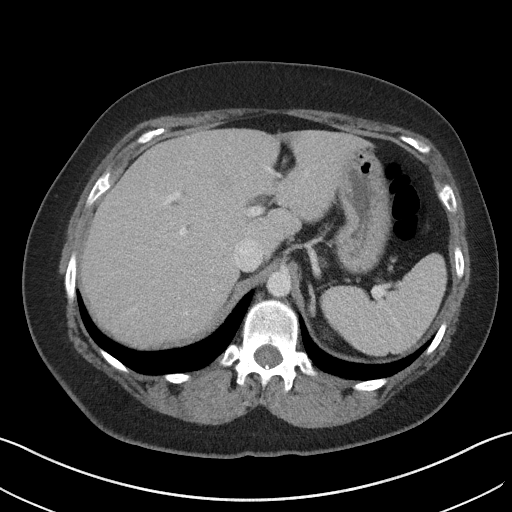
[im 81/94  soft-tissue]
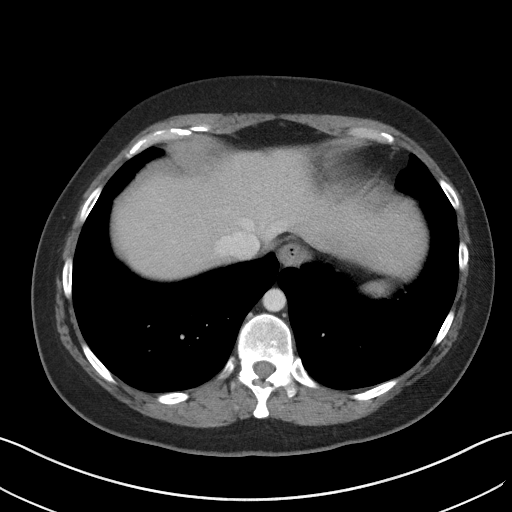
[im 89/94  soft-tissue]
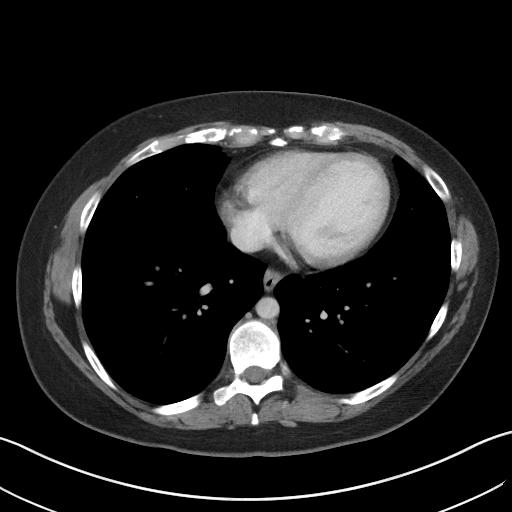

[Series 5: coronal st · coronal · 0.75mm/px · 3 of 94 slices shown]
[im 32/94  soft-tissue]
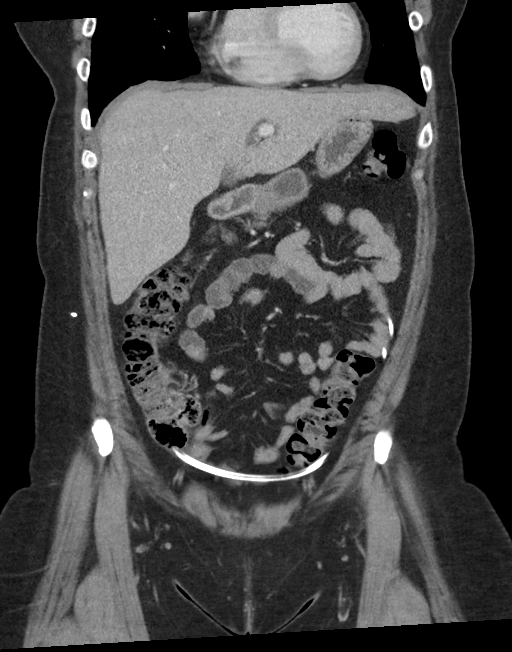
[im 42/94  soft-tissue]
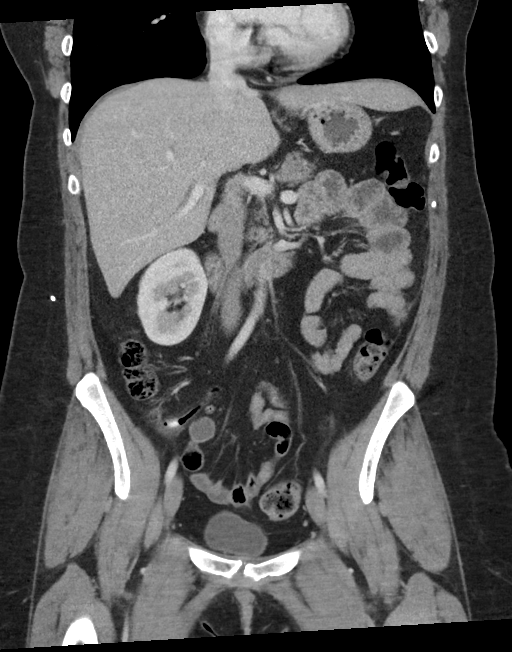
[im 52/94  soft-tissue]
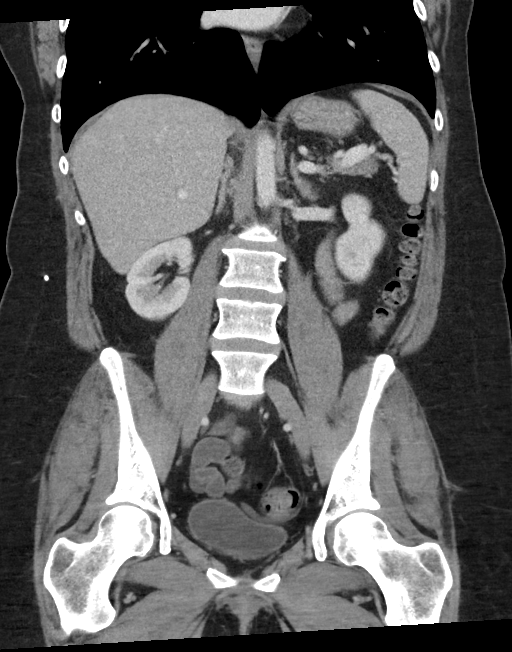

[15 of 46 positions shown; findings below may reference images not displayed]

FINDINGS: Lower chest: Unremarkable.

Hepatobiliary: No suspicious cystic or solid hepatic lesions. No
intra or extrahepatic biliary ductal dilatation. Status post
cholecystectomy.

Pancreas: No pancreatic mass. No pancreatic ductal dilatation. No
pancreatic or peripancreatic fluid collections or inflammatory
changes.

Spleen: Unremarkable.

Adrenals/Urinary Tract: Multiple small nonobstructive calculi
measuring 2-3 mm in the left renal collecting system. No calculi are
identified along the course of either ureter. No
hydroureteronephrosis. Bilateral kidneys, adrenal glands and the
urinary bladder otherwise normal in appearance.

Stomach/Bowel: The appearance of the stomach is normal. There is no
pathologic dilatation of small bowel or colon. Normal appendix.

Vascular/Lymphatic: Mild aortic atherosclerosis, without evidence of
aneurysm or dissection in the abdominal or pelvic vasculature. No
lymphadenopathy noted in the abdomen or pelvis.

Reproductive: Status post hysterectomy. Ovaries are not confidently
identified may be surgically absent or atrophic.

Other: Trace volume of free fluid in the low anatomic pelvis. Small
bore catheter extending from the lower thoracic spine into the low
anatomic pelvis, presumably a shunt catheter. No unexpected fluid
collection associated with the tip of the catheter which is located
in the right lower quadrant of the abdomen. No pneumoperitoneum.

Musculoskeletal: There are no aggressive appearing lytic or blastic
lesions noted in the visualized portions of the skeleton.
IMPRESSION: 1. No acute findings are noted in the abdomen or pelvis to account
for the patient's symptoms. Specifically, the appendix is normal.
2. Mild aortic atherosclerosis.
3. Status post cholecystectomy.
4. Additional incidental findings, as above.
